# Patient Record
Sex: Male | Born: 1935 | Race: White | Hispanic: No | Smoking: Former smoker
Health system: Southern US, Community
[De-identification: ages and names within clinical notes are randomized; demographics above are authoritative.]

## PROBLEM LIST (undated history)

## (undated) DIAGNOSIS — I214 Non-ST elevation (NSTEMI) myocardial infarction: Principal | ICD-10-CM

## (undated) DIAGNOSIS — N183 Chronic kidney disease, stage 3 unspecified: Secondary | ICD-10-CM

## (undated) DIAGNOSIS — I429 Cardiomyopathy, unspecified: Secondary | ICD-10-CM

## (undated) DIAGNOSIS — I2510 Atherosclerotic heart disease of native coronary artery without angina pectoris: Secondary | ICD-10-CM

## (undated) DIAGNOSIS — C61 Malignant neoplasm of prostate: Secondary | ICD-10-CM

## (undated) DIAGNOSIS — K219 Gastro-esophageal reflux disease without esophagitis: Secondary | ICD-10-CM

## (undated) HISTORY — DX: Cardiomyopathy, unspecified: I42.9

## (undated) HISTORY — DX: Malignant neoplasm of prostate: C61

## (undated) HISTORY — DX: Non-ST elevation (NSTEMI) myocardial infarction: I21.4

## (undated) HISTORY — DX: Chronic kidney disease, stage 3 unspecified: N18.30

## (undated) HISTORY — DX: Chronic kidney disease, stage 3 (moderate): N18.3

## (undated) HISTORY — DX: Atherosclerotic heart disease of native coronary artery without angina pectoris: I25.10

---

## 1968-12-26 HISTORY — PX: HERNIA REPAIR: SHX51

## 2011-05-22 DIAGNOSIS — Z23 Encounter for immunization: Secondary | ICD-10-CM | POA: Diagnosis not present

## 2011-07-21 DIAGNOSIS — H348392 Tributary (branch) retinal vein occlusion, unspecified eye, stable: Secondary | ICD-10-CM | POA: Diagnosis not present

## 2011-07-21 DIAGNOSIS — H35359 Cystoid macular degeneration, unspecified eye: Secondary | ICD-10-CM | POA: Diagnosis not present

## 2011-07-21 DIAGNOSIS — H2510 Age-related nuclear cataract, unspecified eye: Secondary | ICD-10-CM | POA: Diagnosis not present

## 2011-10-22 DIAGNOSIS — M810 Age-related osteoporosis without current pathological fracture: Secondary | ICD-10-CM | POA: Diagnosis not present

## 2011-10-22 DIAGNOSIS — M899 Disorder of bone, unspecified: Secondary | ICD-10-CM | POA: Diagnosis not present

## 2011-10-22 DIAGNOSIS — C61 Malignant neoplasm of prostate: Secondary | ICD-10-CM | POA: Diagnosis not present

## 2011-10-23 ENCOUNTER — Encounter: Payer: Medicare Other | Admitting: Internal Medicine

## 2011-10-23 DIAGNOSIS — M949 Disorder of cartilage, unspecified: Secondary | ICD-10-CM | POA: Diagnosis not present

## 2011-10-23 DIAGNOSIS — M899 Disorder of bone, unspecified: Secondary | ICD-10-CM | POA: Diagnosis not present

## 2011-10-23 DIAGNOSIS — C7952 Secondary malignant neoplasm of bone marrow: Secondary | ICD-10-CM | POA: Diagnosis not present

## 2011-10-23 DIAGNOSIS — C61 Malignant neoplasm of prostate: Secondary | ICD-10-CM | POA: Diagnosis not present

## 2011-10-23 DIAGNOSIS — C7951 Secondary malignant neoplasm of bone: Secondary | ICD-10-CM | POA: Diagnosis not present

## 2011-11-09 DIAGNOSIS — H2510 Age-related nuclear cataract, unspecified eye: Secondary | ICD-10-CM | POA: Diagnosis not present

## 2011-11-17 DIAGNOSIS — H2510 Age-related nuclear cataract, unspecified eye: Secondary | ICD-10-CM | POA: Diagnosis not present

## 2011-11-17 DIAGNOSIS — H35359 Cystoid macular degeneration, unspecified eye: Secondary | ICD-10-CM | POA: Diagnosis not present

## 2011-11-17 DIAGNOSIS — H348392 Tributary (branch) retinal vein occlusion, unspecified eye, stable: Secondary | ICD-10-CM | POA: Diagnosis not present

## 2011-11-17 DIAGNOSIS — H35049 Retinal micro-aneurysms, unspecified, unspecified eye: Secondary | ICD-10-CM | POA: Diagnosis not present

## 2011-11-26 DIAGNOSIS — I429 Cardiomyopathy, unspecified: Secondary | ICD-10-CM

## 2011-11-26 DIAGNOSIS — I214 Non-ST elevation (NSTEMI) myocardial infarction: Secondary | ICD-10-CM

## 2011-11-26 HISTORY — DX: Non-ST elevation (NSTEMI) myocardial infarction: I21.4

## 2011-11-26 HISTORY — DX: Cardiomyopathy, unspecified: I42.9

## 2011-11-27 ENCOUNTER — Inpatient Hospital Stay (HOSPITAL_COMMUNITY)
Admission: RE | Admit: 2011-11-27 | Payer: Self-pay | Source: Other Acute Inpatient Hospital | Admitting: Cardiovascular Disease

## 2011-11-27 ENCOUNTER — Inpatient Hospital Stay (HOSPITAL_COMMUNITY)
Admission: AD | Admit: 2011-11-27 | Discharge: 2011-12-04 | DRG: 234 | Disposition: A | Discharge status: Home Health | Payer: Medicare Other | Source: Other Acute Inpatient Hospital | Attending: Cardiothoracic Surgery | Admitting: Cardiothoracic Surgery

## 2011-11-27 ENCOUNTER — Inpatient Hospital Stay (HOSPITAL_COMMUNITY): Payer: Medicare Other

## 2011-11-27 ENCOUNTER — Encounter (HOSPITAL_COMMUNITY)
Admission: AD | Disposition: A | Discharge status: Home / Self Care | Payer: Self-pay | Source: Other Acute Inpatient Hospital | Attending: Cardiothoracic Surgery

## 2011-11-27 ENCOUNTER — Other Ambulatory Visit: Payer: Self-pay | Admitting: Physician Assistant

## 2011-11-27 ENCOUNTER — Encounter (HOSPITAL_COMMUNITY): Payer: Self-pay | Admitting: *Deleted

## 2011-11-27 DIAGNOSIS — J984 Other disorders of lung: Secondary | ICD-10-CM | POA: Diagnosis not present

## 2011-11-27 DIAGNOSIS — Y832 Surgical operation with anastomosis, bypass or graft as the cause of abnormal reaction of the patient, or of later complication, without mention of misadventure at the time of the procedure: Secondary | ICD-10-CM | POA: Diagnosis not present

## 2011-11-27 DIAGNOSIS — Z880 Allergy status to penicillin: Secondary | ICD-10-CM | POA: Diagnosis not present

## 2011-11-27 DIAGNOSIS — M109 Gout, unspecified: Secondary | ICD-10-CM | POA: Diagnosis not present

## 2011-11-27 DIAGNOSIS — I219 Acute myocardial infarction, unspecified: Secondary | ICD-10-CM | POA: Diagnosis not present

## 2011-11-27 DIAGNOSIS — Z888 Allergy status to other drugs, medicaments and biological substances status: Secondary | ICD-10-CM

## 2011-11-27 DIAGNOSIS — N182 Chronic kidney disease, stage 2 (mild): Secondary | ICD-10-CM | POA: Diagnosis present

## 2011-11-27 DIAGNOSIS — I214 Non-ST elevation (NSTEMI) myocardial infarction: Secondary | ICD-10-CM | POA: Diagnosis not present

## 2011-11-27 DIAGNOSIS — I2510 Atherosclerotic heart disease of native coronary artery without angina pectoris: Secondary | ICD-10-CM | POA: Diagnosis present

## 2011-11-27 DIAGNOSIS — I517 Cardiomegaly: Secondary | ICD-10-CM | POA: Diagnosis not present

## 2011-11-27 DIAGNOSIS — I519 Heart disease, unspecified: Secondary | ICD-10-CM | POA: Diagnosis not present

## 2011-11-27 DIAGNOSIS — J811 Chronic pulmonary edema: Secondary | ICD-10-CM | POA: Diagnosis not present

## 2011-11-27 DIAGNOSIS — D62 Acute posthemorrhagic anemia: Secondary | ICD-10-CM | POA: Diagnosis not present

## 2011-11-27 DIAGNOSIS — Z885 Allergy status to narcotic agent status: Secondary | ICD-10-CM | POA: Diagnosis not present

## 2011-11-27 DIAGNOSIS — Z8546 Personal history of malignant neoplasm of prostate: Secondary | ICD-10-CM

## 2011-11-27 DIAGNOSIS — N189 Chronic kidney disease, unspecified: Secondary | ICD-10-CM | POA: Diagnosis not present

## 2011-11-27 DIAGNOSIS — Z01810 Encounter for preprocedural cardiovascular examination: Secondary | ICD-10-CM | POA: Diagnosis not present

## 2011-11-27 DIAGNOSIS — I4891 Unspecified atrial fibrillation: Secondary | ICD-10-CM | POA: Diagnosis not present

## 2011-11-27 DIAGNOSIS — J9819 Other pulmonary collapse: Secondary | ICD-10-CM | POA: Diagnosis not present

## 2011-11-27 DIAGNOSIS — Y9210 Unspecified residential institution as the place of occurrence of the external cause: Secondary | ICD-10-CM | POA: Diagnosis not present

## 2011-11-27 DIAGNOSIS — E7800 Pure hypercholesterolemia, unspecified: Secondary | ICD-10-CM | POA: Diagnosis not present

## 2011-11-27 DIAGNOSIS — M899 Disorder of bone, unspecified: Secondary | ICD-10-CM | POA: Diagnosis not present

## 2011-11-27 DIAGNOSIS — K219 Gastro-esophageal reflux disease without esophagitis: Secondary | ICD-10-CM | POA: Diagnosis present

## 2011-11-27 DIAGNOSIS — Z87891 Personal history of nicotine dependence: Secondary | ICD-10-CM

## 2011-11-27 DIAGNOSIS — E8779 Other fluid overload: Secondary | ICD-10-CM | POA: Diagnosis not present

## 2011-11-27 DIAGNOSIS — Z23 Encounter for immunization: Secondary | ICD-10-CM

## 2011-11-27 DIAGNOSIS — Z951 Presence of aortocoronary bypass graft: Secondary | ICD-10-CM

## 2011-11-27 DIAGNOSIS — D649 Anemia, unspecified: Secondary | ICD-10-CM | POA: Diagnosis present

## 2011-11-27 DIAGNOSIS — Z79899 Other long term (current) drug therapy: Secondary | ICD-10-CM

## 2011-11-27 DIAGNOSIS — R079 Chest pain, unspecified: Secondary | ICD-10-CM | POA: Diagnosis not present

## 2011-11-27 DIAGNOSIS — R918 Other nonspecific abnormal finding of lung field: Secondary | ICD-10-CM | POA: Diagnosis not present

## 2011-11-27 DIAGNOSIS — Z7982 Long term (current) use of aspirin: Secondary | ICD-10-CM | POA: Diagnosis not present

## 2011-11-27 DIAGNOSIS — J90 Pleural effusion, not elsewhere classified: Secondary | ICD-10-CM | POA: Diagnosis not present

## 2011-11-27 DIAGNOSIS — Z09 Encounter for follow-up examination after completed treatment for conditions other than malignant neoplasm: Secondary | ICD-10-CM | POA: Diagnosis not present

## 2011-11-27 HISTORY — PX: LEFT HEART CATHETERIZATION WITH CORONARY ANGIOGRAM: SHX5451

## 2011-11-27 HISTORY — DX: Gastro-esophageal reflux disease without esophagitis: K21.9

## 2011-11-27 LAB — URINALYSIS, ROUTINE W REFLEX MICROSCOPIC
Bilirubin Urine: NEGATIVE
Glucose, UA: NEGATIVE mg/dL
Hgb urine dipstick: NEGATIVE
Ketones, ur: NEGATIVE mg/dL
Nitrite: NEGATIVE
pH: 5.5 (ref 5.0–8.0)

## 2011-11-27 LAB — CBC
MCH: 30.5 pg (ref 26.0–34.0)
MCV: 90.8 fL (ref 78.0–100.0)
Platelets: 242 10*3/uL (ref 150–400)
RBC: 3.71 MIL/uL — ABNORMAL LOW (ref 4.22–5.81)

## 2011-11-27 LAB — CARDIAC PANEL(CRET KIN+CKTOT+MB+TROPI): Relative Index: 5.5 — ABNORMAL HIGH (ref 0.0–2.5)

## 2011-11-27 LAB — PROTIME-INR: Prothrombin Time: 14.3 seconds (ref 11.6–15.2)

## 2011-11-27 SURGERY — LEFT HEART CATHETERIZATION WITH CORONARY ANGIOGRAM
Anesthesia: LOCAL

## 2011-11-27 MED ORDER — SODIUM CHLORIDE 0.9 % IJ SOLN: 3.0000 mL | Freq: Two times a day (BID) | INTRAMUSCULAR | Status: DC

## 2011-11-27 MED ORDER — ALPRAZOLAM 0.25 MG PO TABS: 0.2500 mg | ORAL_TABLET | Freq: Two times a day (BID) | ORAL | Status: DC | PRN

## 2011-11-27 MED ORDER — NITROGLYCERIN 0.3 MG SL SUBL: 0.3000 mg | SUBLINGUAL_TABLET | SUBLINGUAL | Status: DC | PRN

## 2011-11-27 MED ORDER — SODIUM CHLORIDE 0.9 % IJ SOLN: 3.0000 mL | INTRAMUSCULAR | Status: DC | PRN

## 2011-11-27 MED ORDER — SODIUM CHLORIDE 0.9 % IV SOLN: 250.0000 mL | INTRAVENOUS | Status: DC | PRN

## 2011-11-27 MED ORDER — NITROGLYCERIN 0.4 MG SL SUBL: 0.4000 mg | SUBLINGUAL_TABLET | SUBLINGUAL | Status: DC | PRN

## 2011-11-27 MED ORDER — VERAPAMIL HCL 2.5 MG/ML IV SOLN
INTRAVENOUS | Status: AC
  Filled 2011-11-27: qty 2

## 2011-11-27 MED ORDER — SODIUM CHLORIDE 0.9 % IV SOLN: INTRAVENOUS | Status: DC

## 2011-11-27 MED ORDER — ATORVASTATIN CALCIUM 80 MG PO TABS: 80.0000 mg | ORAL_TABLET | Freq: Every day | ORAL | Status: DC

## 2011-11-27 MED ORDER — ONDANSETRON HCL 4 MG/2ML IJ SOLN: 4.0000 mg | Freq: Four times a day (QID) | INTRAMUSCULAR | Status: DC | PRN

## 2011-11-27 MED ORDER — DIAZEPAM 5 MG PO TABS: 5.0000 mg | ORAL_TABLET | ORAL | Status: AC

## 2011-11-27 MED ORDER — LIDOCAINE HCL (PF) 1 % IJ SOLN
INTRAMUSCULAR | Status: AC
  Filled 2011-11-27: qty 30

## 2011-11-27 MED ORDER — NITROGLYCERIN 0.2 MG/ML ON CALL CATH LAB
INTRAVENOUS | Status: AC
  Filled 2011-11-27: qty 1

## 2011-11-27 MED ORDER — MIDAZOLAM HCL 2 MG/2ML IJ SOLN
INTRAMUSCULAR | Status: AC
  Filled 2011-11-27: qty 2

## 2011-11-27 MED ORDER — ASPIRIN 81 MG PO CHEW
81.0000 mg | CHEWABLE_TABLET | Freq: Every day | ORAL | Status: DC
  Administered 2011-11-28: 81 mg via ORAL
  Filled 2011-11-27: qty 1

## 2011-11-27 MED ORDER — ATORVASTATIN CALCIUM 80 MG PO TABS
80.0000 mg | ORAL_TABLET | Freq: Every day | ORAL | Status: DC
  Administered 2011-11-28 – 2011-12-01 (×3): 80 mg via ORAL
  Filled 2011-11-27 (×5): qty 1

## 2011-11-27 MED ORDER — HEPARIN (PORCINE) IN NACL 2-0.9 UNIT/ML-% IJ SOLN
INTRAMUSCULAR | Status: AC
  Filled 2011-11-27: qty 2000

## 2011-11-27 MED ORDER — ASPIRIN 81 MG PO CHEW: 324.0000 mg | CHEWABLE_TABLET | ORAL | Status: DC

## 2011-11-27 MED ORDER — FENTANYL CITRATE 0.05 MG/ML IJ SOLN
INTRAMUSCULAR | Status: AC
  Filled 2011-11-27: qty 2

## 2011-11-27 MED ORDER — ACETAMINOPHEN 325 MG PO TABS
650.0000 mg | ORAL_TABLET | ORAL | Status: DC | PRN
  Administered 2011-11-28: 325 mg via ORAL
  Filled 2011-11-27: qty 2

## 2011-11-27 MED ORDER — HEPARIN (PORCINE) IN NACL 100-0.45 UNIT/ML-% IJ SOLN
950.0000 [IU]/h | INTRAMUSCULAR | Status: DC
  Administered 2011-11-28: 950 [IU]/h via INTRAVENOUS
  Filled 2011-11-27 (×2): qty 250

## 2011-11-27 MED ORDER — PANTOPRAZOLE SODIUM 40 MG PO TBEC
40.0000 mg | DELAYED_RELEASE_TABLET | Freq: Every day | ORAL | Status: DC
  Administered 2011-11-28: 40 mg via ORAL
  Filled 2011-11-27: qty 1

## 2011-11-27 MED ORDER — SODIUM CHLORIDE 0.9 % IV SOLN
INTRAVENOUS | Status: AC
  Administered 2011-11-27: 23:00:00 via INTRAVENOUS

## 2011-11-27 MED ORDER — METOPROLOL TARTRATE 12.5 MG HALF TABLET
12.5000 mg | ORAL_TABLET | Freq: Two times a day (BID) | ORAL | Status: DC
  Administered 2011-11-28 (×2): 12.5 mg via ORAL
  Filled 2011-11-27 (×4): qty 1

## 2011-11-27 MED ORDER — ASPIRIN EC 81 MG PO TBEC: 81.0000 mg | DELAYED_RELEASE_TABLET | Freq: Every day | ORAL | Status: DC

## 2011-11-27 MED ORDER — METOPROLOL TARTRATE 12.5 MG HALF TABLET
12.5000 mg | ORAL_TABLET | Freq: Four times a day (QID) | ORAL | Status: DC
  Administered 2011-11-27: 12.5 mg via ORAL
  Filled 2011-11-27: qty 1

## 2011-11-27 MED ORDER — ACETAMINOPHEN 325 MG PO TABS: 650.0000 mg | ORAL_TABLET | ORAL | Status: DC | PRN

## 2011-11-27 MED ORDER — NITROGLYCERIN 2 % TD OINT
0.5000 [in_us] | TOPICAL_OINTMENT | Freq: Four times a day (QID) | TRANSDERMAL | Status: DC
  Filled 2011-11-27: qty 30

## 2011-11-27 NOTE — Consult Note (Signed)
[address]            [place] [address]          [phone]       [name] Medical Record #[id number] Date of Birth: 1927-03-11  No ref. provider found No primary provider on file.  Chief Complaint:   No chief complaint on file.  chest pain  History of Present Illness:      The patient is an 78 year old gentleman admitted in transfer from [facility] hospital with unstable angina and positive cardiac enzymes with a troponin of 8.1. He has had nocturnal angina and exertional chest pain of increasing severity over the past 2 weeks. He was transferred to  for urgent cardiac catheterization today. Dr. Tedra Senegal performed coronary care grams without ventriculogram the to elevated BUN-creatinine. Patient has a high-grade 95% stenosis proximal LAD just past the left main bifurcation. There is moderate ostial circumflex disease and ostial RCA stenosis. He is graftable coronary vessels and LV function will be evaluated by a pending transthoracic echocardiogram. The patient is currently stable on heparin therapy in a sinus rhythm with stable blood pressure. Chest x-ray shows some interstitial edema.  Current Activity/ Functional Status: Patient highly functional still works part-time is in a Health and safety inspector in [place]   Past Medical History  Diagnosis Date  . Cancer     prostate  . GERD (gastroesophageal reflux disease)     Past Surgical History  Procedure Date  . Hernia repair 1970's     history of laparotomy for intestinal torsion-volvulus  History  Smoking status  . Former Smoker -- 2.0 packs/day for 40 years  . Types: Cigars, Cigarettes  Smokeless tobacco  . Not on file    History  Alcohol Use  . 0.6 oz/week  . 1 Shots of liquor per week    History   Social History  . Marital Status: Divorced    Spouse Name: N/A    Number of Children: N/A  . Years of Education: N/A   Occupational History  . Not on file.    Social History Main Topics  . Smoking status: Former Smoker -- 2.0 packs/day for 40 years    Types: Cigars, Cigarettes  . Smokeless tobacco: Not on file  . Alcohol Use: 0.6 oz/week    1 Shots of liquor per week  . Drug Use: No  . Sexually Active: No   Other Topics Concern  . Not on file   Social History Narrative  . No narrative on file    Allergies  Allergen Reactions  . Morphine And Related Shortness Of Breath  . Penicillins Shortness Of Breath    Current Facility-Administered Medications  Medication Dose Route Frequency Provider Last Rate Last Dose  . 0.9 %  sodium chloride infusion   Intravenous Continuous Herby Abraham, MD      . 0.9 %  sodium chloride infusion  250 mL Intravenous PRN Prescott Parma, PA      . 0.9 %  sodium chloride infusion   Intravenous Continuous Prescott Parma, PA      . acetaminophen (TYLENOL) tablet 650 mg  650 mg Oral Q4H PRN Herby Abraham, MD      . ALPRAZolam Prudy Feeler) tablet 0.25 mg  0.25 mg Oral BID PRN Herby Abraham, MD      . aspirin chewable tablet 81 mg  81 mg Oral Daily Herby Abraham, MD      . atorvastatin (LIPITOR) tablet 80 mg  80 mg Oral q1800 Herby Abraham, MD      . diazepam (VALIUM) tablet 5 mg  5 mg Oral On Call Prescott Parma, PA      . fentaNYL (SUBLIMAZE) 0.05 MG/ML injection           . heparin 2-0.9 UNIT/ML-% infusion           . heparin ADULT infusion 100 units/mL (25000 units/250 mL)  950 Units/hr Intravenous Continuous Herby Abraham, MD      . lidocaine (XYLOCAINE) 1 % injection           . metoprolol tartrate (LOPRESSOR) tablet 12.5 mg  12.5 mg Oral QID Prescott Parma, PA      . midazolam (VERSED) 2 MG/2ML injection           . nitroGLYCERIN (NITROGLYN) 2 % ointment 0.5 inch  0.5 inch Topical Q6H Prescott Parma, PA      . nitroGLYCERIN (NITROSTAT) SL tablet 0.4 mg  0.4 mg Sublingual Q5 Min x 3 PRN Prescott Parma, PA      . nitroGLYCERIN (NTG ON-CALL) 0.2 mg/mL injection           . ondansetron (ZOFRAN) injection 4  mg  4 mg Intravenous Q6H PRN Herby Abraham, MD      . pantoprazole (PROTONIX) EC tablet 40 mg  40 mg Oral Q1200 Prescott Parma, PA      . verapamil (ISOPTIN) 2.5 MG/ML injection           . DISCONTD: acetaminophen (TYLENOL) tablet 650 mg  650 mg Oral Q4H PRN Prescott Parma, PA      . DISCONTD: aspirin chewable tablet 324 mg  324 mg Oral Pre-Cath Prescott Parma, PA      . DISCONTD: aspirin EC tablet 81 mg  81 mg Oral Daily Prescott Parma, [place]      . DISCONTD: atorvastatin (LIPITOR) tablet 80 mg  80 mg Oral q1800 Prescott Parma, PA      . DISCONTD: nitroGLYCERIN (NITROSTAT) SL tablet 0.3 mg  0.3 mg Sublingual Q5 min PRN Herby Abraham, MD      . DISCONTD: ondansetron [facility]) injection 4 mg  4 mg Intravenous Q6H PRN Prescott Parma, PA      . DISCONTD: sodium chloride 0.9 % injection 3 mL  3 mL Intravenous Q12H Prescott Parma, PA      . DISCONTD: sodium chloride 0.9 % injection 3 mL  3 mL Intravenous PRN Prescott Parma, PA         History reviewed. No pertinent family history.   Review of Systems:     Cardiac Review of Systems: Y or N  Chest Pain y[    ]  Resting SOB [n   ] Exertional SOB  Cove.Etienne  ]  [place].Mounts [  ]   Pedal Edema [  n ]    Palpitations [  ] Syncope  [n  ]   Presyncope [   ]  General Review of Systems: [Y] = yes [  ]=no Constitional: recent weight change [  ]; anorexia [  ]; fatigue [  ]; nausea [  ]; night sweats [  ]; fever [n  ]; or chills [ n ];  Eye : blurred vision [  ]; diplopia [   ]; vision changes [  ];  Amaurosis fugax[  ]; Resp: cough [  ];  wheezing[  ];  hemoptysis[n  ]; shortness of breath[  y]; paroxysmal nocturnal dyspnea[y  ]; dyspnea on exertion[  y]; or orthopnea[  ];  GI:  gallstones[  ], vomiting[  ];  dysphagia[  ]; melena[  ];  hematochezia [  ]; heartburn[  ];   Hx of  Colonoscopy[  ]; GU: kidney stones [  ]; hematuria[  ];   dysuria [  ];   nocturia[  ];  history of     obstruction [  ];                 Skin: rash, swelling[  ];, hair loss[  ];  peripheral edema[  ];  or itching[  ]; Musculosketetal: myalgias[  ];  joint swelling[  ];  joint erythema[  ];  joint pain[  ];  back pain[  ];  Heme/Lymph: bruising[  ];  bleeding[  ];  anemia[  ];  Neuro: TIA[  ];  headaches[  ];  stroke[  ];  vertigo[  ];  seizures[  ];   paresthesias[  ];  difficulty walking[n  ];  Psych:depression[  ]; anxiety[  ];  Endocrine: diabetes[  ];  thyroid dysfunction[  ];  Immunizations: Flu [  ]; Pneumococcal[  ];  Other:  Physical Exam: BP 106/66  Pulse 63  Temp 99 F (37.2 C) (Oral)  Resp 16  Ht 5\' 9"  (1.753 m)  Wt 173 lb 15.1 oz (78.9 kg)  BMI 25.69 kg/m2  Exam General alert and oriented in the CCU in no acute distress compression dressing on right wrist at radial artery cath site  HEENT normocephalic pupils equal dentition good  Neck without JVD mass or carotid bruit  Chest no deformity or tenderness breath sounds clear bilaterally Cardiac regular rhythm without murmur or gallop Abdomen well-healed mid line surgical scar abdomen without pulsatile mass or tenderness Extremities no clubbing cyanosis edema Vascular positive palpable pulses in the extremities no venous insufficiency noted neurologic alert and oriented no focal motor deficit  Diagnostic Studies & Laboratory data:  Coronary care and is reviewed with Dr. Tedra Senegal 2-D echo pending   Recent Radiology Findings:   Dg Chest Port 1 View  11/27/2011  *RADIOLOGY REPORT*  Clinical Data: Chest pain.  PORTABLE CHEST - 1 VIEW  Comparison: Chest x-ray 82,013.  Findings: Lung volumes are low.  Diffuse prominence of the interstitial markings, most pronounced in the lung bases and periphery of the lungs, similar to priors, compatible with underlying interstitial lung disease.  No definite acute consolidative airspace disease.  No pleural effusions.  Pulmonary vasculature and the  cardiomediastinal silhouette are within normal limits.  Atherosclerosis in the thoracic aorta.  IMPRESSION: 1.  Appearance of the lungs is again compatible with an underlying interstitial lung disease.  This could be better characterized with high resolution CT of the thorax if clinically indicated. 2.  Atherosclerosis.  Original Report Authenticated By: Florencia Reasons, M.D.      Recent Lab Findings: No results found for this basename: WBC, HGB, HCT, PLT, GLUCOSE, CHOL, TRIG, HDL, LDLDIRECT, LDLCALC, ALT, AST, NA, K, CL, CREATININE, BUN, CO2, TSH, INR, GLUF, HGBA1C      Assessment / Plan:   Stabilization on IV heparin beta blockers and nitroglycerin Preoperative evaluation of a 2-D echo carotid Dopplers Surgical coronary graduation and next  36-48 hours.   Plan discussed with patient and he understands and agrees to proceed.

## 2011-11-27 NOTE — Progress Notes (Signed)
Patient stable.  No chest pain.  His only pain was at 2 am for an hour, and he has had none since. Echo study reviewed.  Official report pending.  Anteroseptal area is severely hypokinetic, but there is some myocardial thickening.   Hopefully it will recover.   Surgery is planned for early in week with grafts to LAD, circ, and RCA.   Patient is agreeable.

## 2011-11-27 NOTE — Progress Notes (Signed)
Day of Surgery Procedure(s) (LRB): LEFT HEART CATHETERIZATION WITH CORONARY ANGIOGRAM (N/A) Subjective:unstable angina with SEMI Now pain free Objective: Vital signs in last 24 hours: Temp:  [99 F (37.2 C)] 99 F (37.2 C) (08/02 2104) Pulse Rate:  [63-120] 63  (08/02 2104) Cardiac Rhythm:  [-]  Resp:  [16] 16  (08/02 2104) BP: (106)/(66) 106/66 mmHg (08/02 2104) Weight:  [173 lb 15.1 oz (78.9 kg)] 173 lb 15.1 oz (78.9 kg) (08/02 2104)  Hemodynamic parameters for last 24 hours:  NSR  Intake/Output from previous day:   Intake/Output this shift:    Lungs clear extrem warm R wrist compression device  Lab Results: No results found for this basename: WBC:2,HGB:2,HCT:2,PLT:2 in the last 72 hours BMET: No results found for this basename: NA:2,K:2,CL:2,CO2:2,GLUCOSE:2,BUN:2,CREATININE:2,CALCIUM:2 in the last 72 hours  PT/INR: No results found for this basename: LABPROT,INR in the last 72 hours ABG No results found for this basename: phart, pco2, po2, hco3, tco2, acidbasedef, o2sat   CBG (last 3)  No results found for this basename: GLUCAP:3 in the last 72 hours  Assessment/Plan: S/P Procedure(s) (LRB): LEFT HEART CATHETERIZATION WITH CORONARY ANGIOGRAM (N/A) CABG in 48 hrs if creat remains stable   LOS: 0 days    VAN TRIGT III,PETER 11/27/2011

## 2011-11-27 NOTE — Progress Notes (Signed)
  Echocardiogram 2D Echocardiogram has been performed.  [name] 11/27/2011, 9:59 PM

## 2011-11-27 NOTE — H&P (Signed)
NAME:  GHAZI, RUMPF ROOM: 209  UNIT NUMBER:  [id number] LOCATION: 76F 209 01 ADM/VISIT DATE:  11/27/2011   ADM Vaughan BrownerKathaleen Grinder:  [phone] DOB: 12-11-1926   PRIMARY CARDIOLOGIST:  Marca Ancona, M.D. (new).  REFERRING PHYSICIAN:  Doreen Beam, M.D.  REASON FOR CONSULTATION:  Chest pain.  HISTORY OF PRESENT ILLNESS:  Mr. Willden is a very pleasant 78 year old male, with no known history of heart disease, directly admitted from Dr. Sherril Croon' office a short while ago for a evaluation of chest pain.   A 12-lead EKG in the office was abnormal, with symmetric T-wave inversion in the anteroseptal leads as well as in lead AVL.  Initial cardiac markers were also abnormal, with CPK 500/4.6 (9.3%), and a troponin I of 8.6.  Patient presents with no known history of hypertension, diabetes mellitus, dyslipidemia, or family history of premature coronary artery disease.  He quit smoking tobacco in 1990.  Patient also presents with no prior formal cardiology evaluation or diagnostic testing.  Patient had new onset nocturnal chest discomfort, approximately 2 weeks ago, which awoke him from sleep during early morning hours.  Episode lasted approximately 20 minutes and was moderately intense (4/10), with some associated diaphoresis, but no radiation to the jaw or upper extremities.  He referred to it as a "pressure sensation", which he attributed to his occasional reflux symptoms.  At approximately 3 a.m. this morning, however, he had a more severe episode (7/10), again associated with diaphoresis, and this time with some mild nausea but no vomiting.  Again there was no radiation to the jaw or upper extremities.  This episode lasted approximately 1 hour in duration.  Patient denies any antecedent history of exertional angina.  Over the last month or so, however, he has become more fatigued and "tired" while doing his usual yard work.  He denies any symptoms suggestive of congestive heart failure.  ALLERGIES:  MORPHINE,  PENICILLIN (ANAPHYLAXIS).  HOME MEDICATIONS: 1. Ibuprofen 200 mg p.r.n. 2. Stool softener 1 tablet daily. 3. Calcium 500 mg daily. 4. Multivitamin 1 tablet daily.  PAST MEDICAL HISTORY: 1. Adenocarcinoma of the prostate. a. Bilateral orchiectomy, 2008. 2. Osteopenia. 3. Chronic kidney disease. 4. GERD. 5. Tobacco, remote.  SURGICAL HISTORY:  Abdominal surgery, inguinal hernia repair (2).  SOCIAL HISTORY:  Married, semiretired Scientist, clinical (histocompatibility and immunogenetics).  Quit smoking in 1990, and started at the age of 61.  Has 5 children.  Drinks several ounces of alcohol per week.  FAMILY HISTORY:  Negative for coronary artery disease.  REVIEW OF SYSTEMS:  Denies history of hypertension, diabetes mellitus, dyslipidemia, TIA, stroke, MI, or CHF.  Denies any evidence of overt bleeding.  The remaining systems reviewed and are negative.  PHYSICAL EXAMINATION:  Vital signs:  Blood pressure currently 118/78 , pulse 72 , regular, respirations 20 , temperature 97.5, sats 96% on room air, weight 170 pounds.  General:  An 78 year old male, well nourished, well developed, sitting upright in no distress.  HEENT:  Normocephalic, atraumatic.  PERRLA, EOMI.  Neck:  Palpable bilateral carotid pulses without bruits; no JVD at 90 degrees.  Lungs:  Mild right base crackles, no wheezes.  Heart:  Regular rate and rhythm.  No significant murmurs.  No rubs or gallops.  Abdomen:  Soft, intact bowel sounds.  Extremities:  Palpable bilateral femoral pulses without bruits; palpable bilateral dorsalis pedis pulses, with no significant peripheral edema.  Skin:  Warm and dry.  Musculoskeletal:  No obvious deformity.  Neurologic:  Alert and oriented.  RADIOLOGIC STUDIES:  Admission chest x-ray:  Nonspecific reticular interstitial marking; atelectasis/infiltrative density in right base and posterior base; osteopenia with degenerative spondylosis; new lower thoracic vertebral body compression fracture, since prior study of 2008.  Admission EKG:   Normal sinus rhythm at 74 bpm; LAD; symmetric T-wave inversion in leads V1, V2, AVL, and slight T-wave inversion in lead 1.  LABORATORY DATA:  CPK 500/46 (9.3%).  Troponin I 8.6.  INR 1.0.  D-dimer 0.35.  BNP 440.  Sodium 139, potassium 4.4, BUN 37, creatinine 1.4 (GFR 49), glucose 113.  CBC:  WBC 9900, hemoglobin 13, hematocrit 40, and platelets 311,000.  IMPRESSION: 1. Acute coronary syndrome. a. Anterolateral electrocardiogram changes. b. Troponin I 8.6. 2. Remote tobacco. 3. Gastroesophageal reflux disease. 4. Renal insufficiency.  PLAN:  Recommendation is to transfer patient directly to [facility], so as to proceed with diagnostic coronary angiography and probable percutaneous coronary intervention.  The patient is in agreement with this recommendation, and the risks/benefits of the procedure were discussed.  Regarding medications, the patient has received full-dose coated aspirin and 1 dose of Lovenox 77 mg.  We will initiate beta blocker therapy with 5 mg IV Lopressor now, Nitrol paste 1/2 inch, and high-dose statin.  Lipid status will be assessed with FLP in a.m.  Cardiac markers will be cycled.  Followup EKG in a.m.  Close monitoring of renal function, with followup metabolic profile in a.m., as well.  This plan was discussed with, and approved by, Dr. Marca Ancona.  Dictated by Rozell Searing, PA for Marca Ancona, M.D.   __________________________    Rozell Searing, P.A. Alinda Money D: 11/27/2011 1507 T: 11/27/2011 1526 P: Jefm Petty

## 2011-11-27 NOTE — Progress Notes (Addendum)
ANTICOAGULATION CONSULT NOTE - Initial Consult  Pharmacy Consult for Heparin Indication: ACS/STEMI  Allergies  Allergen Reactions  . Morphine And Related Shortness Of Breath  . Penicillins Shortness Of Breath    Patient Measurements: Height: 5\' 9"  (175.3 cm) Weight: 173 lb 15.1 oz (78.9 kg) IBW/kg (Calculated) : 70.7  Heparin Dosing Weight: 78.9kg  Vital Signs: Temp: 99 F (37.2 C) (08/02 2104) Temp src: Oral (08/02 2104) BP: 106/66 mmHg (08/02 2104) Pulse Rate: 63  (08/02 2104)  Labs: No results found for this basename: HGB:2,HCT:3,PLT:3,APTT:3,LABPROT:3,INR:3,HEPARINUNFRC:3,CREATININE:3,CKTOTAL:3,CKMB:3,TROPONINI:3 in the last 72 hours  CrCl is unknown because no creatinine reading has been taken.   Medical History: Past Medical History  Diagnosis Date  . Cancer     prostate    Medications:  Scheduled:    . aspirin  81 mg Oral Daily  . atorvastatin  80 mg Oral q1800  . diazepam  5 mg Oral On Call  . fentaNYL      . heparin      . lidocaine      . metoprolol tartrate  12.5 mg Oral QID  . midazolam      . nitroGLYCERIN  0.5 inch Topical Q6H  . nitroGLYCERIN      . pantoprazole  40 mg Oral Q1200  . verapamil      . DISCONTD: aspirin  324 mg Oral Pre-Cath  . DISCONTD: aspirin EC  81 mg Oral Daily  . DISCONTD: atorvastatin  80 mg Oral q1800  . DISCONTD: sodium chloride  3 mL Intravenous Q12H    Assessment: 78 yr old male to start heparin 8 hours after sheath removal for ACS/STEMI.  Sheath removal was at 2020 tonight.  Goal of Therapy:  Heparin level goal 0.3-0.5 Monitor platelets by anticoagulation protocol: Yes   Plan:  Start Heparin at 950 units/hr at 0430 on 11/28/11. Check heparin level 8 hours after drip start. Daily heparin level/cbc.   Wendie Simmer, PharmD, BCPS Clinical Pharmacist  Pager: [phone]

## 2011-11-27 NOTE — H&P (Signed)
  The patient was seen by Dr. Shirlee Latch and I have also interviewed the patient.  Note is made of allergies to penicillin and morphine.  He began with chest pain at 300 am and then saw his primary MD.  He has some T wave inversion anteriorly and markedly positive enzymes.  His BUN and CR are mildly elevated, but we have been hydrating him in the holding area.  I have reviewed the risks and benefits of cardiac cath with the patient.  He received enox at about 2:30 pm today.  He has had ASA..    We will proceed with diagnostic cath.  Depending on findings, we will make plans regarding definitive treatment, but we will need to limit contrast.

## 2011-11-27 NOTE — CV Procedure (Signed)
   Cardiac Catheterization Procedure Note  Name: [name] [name] MRN: [id number] DOB: 13-Mar-1927  Procedure: Placement of catheters without left heart cath, Selective Coronary Angiography,   Indication: 78 year old male with recent onset of chest pain, positive enzymes, and anterior T changes.  He is stable with CKD, stage 2.  Risks reviewed and patient agreeable.     Procedural Details: The left wrist was prepped, draped, and anesthetized with 1% lidocaine. Using the modified Seldinger technique, a 5 French sheath was introduced into the left radial artery. 3 mg of verapamil was administered through the sheath;  No heparin was given as the patient was given [place] Lovenox prior to arrival. Standard Judkins catheters were used for selective coronary angiography.  Access to the LCA was quite difficult, possibly due to some ostial disease. A 3.0 GC eventually was successfully navigated.  Catheter exchanges were performed over an exchange length guidewire. There were no immediate procedural complications. A TR band was used for radial hemostasis at the completion of the procedure.  The patient was transferred to the post catheterization recovery area for further monitoring.  Dr. Donata Clay came to the lab and we reviewed the films in detail.  Given the circumstances we agreed that CABG appeared to be the best option.    Procedural Findings: Hemodynamics: AO 98/70  (82) LV not done  Coronary angiography: Coronary dominance: right  Left main stem: ostial 50-60% narrowing without hemodynamic obstruction  Left anterior descending (LAD): Takes a sharp turn at the proximal takeoff followed by a 90% eccentric ruptured plaque.  There is 50% eccentric narrowing after the large diagonal.  The mid vessel has a calcified 80% lesion in the mid distal junction. There is diffuse luminal irregularities in the diagonal.    Left circumflex (LCx): provides a small, diffusely irregular OM1.  The AV circ just after the OM 1  has segmental 60% narrowing. The OM is large a graftable.   Right coronary artery (RCA): Large caliber vessel with ostial narrowing of 60-70% and calcified.  Just after this, there is a [place] RCA with mild diffuse luminal irregularities.  The PDA and PLA are relatively large vessels.    Left ventriculography: Not done due to renal insufficiency.  Final Conclusions:   1.  High grade proximal LAD disease with incidental LMCA, ostial RCA, and other findings as noted.  Recommendations:  1.  2D echo at bedside 2.  Resume heparin in 6 hours when enox has warn off.  3.  Keep in unit over weekend.   Shawnie Pons 11/27/2011, 8:42 PM

## 2011-11-28 ENCOUNTER — Inpatient Hospital Stay (HOSPITAL_COMMUNITY): Payer: Medicare Other

## 2011-11-28 DIAGNOSIS — I214 Non-ST elevation (NSTEMI) myocardial infarction: Secondary | ICD-10-CM | POA: Diagnosis present

## 2011-11-28 DIAGNOSIS — I2510 Atherosclerotic heart disease of native coronary artery without angina pectoris: Secondary | ICD-10-CM | POA: Diagnosis not present

## 2011-11-28 DIAGNOSIS — D649 Anemia, unspecified: Secondary | ICD-10-CM | POA: Diagnosis present

## 2011-11-28 LAB — CARDIAC PANEL(CRET KIN+CKTOT+MB+TROPI)
CK, MB: 14 ng/mL (ref 0.3–4.0)
Relative Index: 4.8 — ABNORMAL HIGH (ref 0.0–2.5)
Relative Index: 3.7 — ABNORMAL HIGH (ref 0.0–2.5)
Total CK: 294 U/L — ABNORMAL HIGH (ref 7–232)
Troponin I: 3.96 ng/mL (ref <0.30)
Troponin I: 4.22 ng/mL (ref <0.30)

## 2011-11-28 LAB — BLOOD GAS, ARTERIAL
Acid-Base Excess: 0.2 mmol/L (ref 0.0–2.0)
Bicarbonate: 24.1 mEq/L — ABNORMAL HIGH (ref 20.0–24.0)
Drawn by: 347621
FIO2: 0.28 %
O2 Saturation: 98.9 %
Patient temperature: 98.6
TCO2: 25.2 mmol/L (ref 0–100)
pCO2 arterial: 37.4 mmHg (ref 35.0–45.0)
pH, Arterial: 7.425 (ref 7.350–7.450)
pO2, Arterial: 114 mmHg — ABNORMAL HIGH (ref 80.0–100.0)

## 2011-11-28 LAB — CBC
Hemoglobin: 10.9 g/dL — ABNORMAL LOW (ref 13.0–17.0)
MCH: 31.1 pg (ref 26.0–34.0)
MCV: 91.7 fL (ref 78.0–100.0)
RBC: 3.51 MIL/uL — ABNORMAL LOW (ref 4.22–5.81)

## 2011-11-28 LAB — HEPARIN LEVEL (UNFRACTIONATED): Heparin Unfractionated: 0.45 IU/mL (ref 0.30–0.70)

## 2011-11-28 LAB — LIPID PANEL
Cholesterol: 162 mg/dL (ref 0–200)
HDL: 59 mg/dL (ref >39)
LDL Cholesterol: 80 mg/dL (ref 0–99)
LDL Cholesterol: 79 mg/dL (ref 0–99)
Total CHOL/HDL Ratio: 2.7 RATIO
Triglycerides: 120 mg/dL (ref <150)
VLDL: 24 mg/dL (ref 0–40)
VLDL: 24 mg/dL (ref 0–40)

## 2011-11-28 LAB — BASIC METABOLIC PANEL
CO2: 23 mEq/L (ref 19–32)
Glucose, Bld: 97 mg/dL (ref 70–99)
Potassium: 3.9 mEq/L (ref 3.5–5.1)
Sodium: 140 mEq/L (ref 135–145)

## 2011-11-28 LAB — ABO/RH: ABO/RH(D): AB POS

## 2011-11-28 LAB — TSH: TSH: 1.562 u[IU]/mL (ref 0.350–4.500)

## 2011-11-28 LAB — PREPARE RBC (CROSSMATCH)

## 2011-11-28 MED ORDER — NITROGLYCERIN IN D5W 200-5 MCG/ML-% IV SOLN
2.0000 ug/min | INTRAVENOUS | Status: AC
  Administered 2011-11-29: 10 ug/min via INTRAVENOUS
  Filled 2011-11-28: qty 250

## 2011-11-28 MED ORDER — BISACODYL 5 MG PO TBEC: 5.0000 mg | DELAYED_RELEASE_TABLET | Freq: Once | ORAL | Status: DC

## 2011-11-28 MED ORDER — MAGNESIUM SULFATE 50 % IJ SOLN
40.0000 meq | INTRAMUSCULAR | Status: DC
  Filled 2011-11-28: qty 10

## 2011-11-28 MED ORDER — POTASSIUM CHLORIDE 2 MEQ/ML IV SOLN
80.0000 meq | INTRAVENOUS | Status: DC
  Filled 2011-11-28: qty 40

## 2011-11-28 MED ORDER — MAGNESIUM HYDROXIDE 400 MG/5ML PO SUSP
30.0000 mL | Freq: Every day | ORAL | Status: DC | PRN
  Administered 2011-11-28: 30 mL via ORAL
  Filled 2011-11-28: qty 30

## 2011-11-28 MED ORDER — MOXIFLOXACIN HCL IN NACL 400 MG/250ML IV SOLN
400.0000 mg | INTRAVENOUS | Status: DC
  Filled 2011-11-28 (×2): qty 250

## 2011-11-28 MED ORDER — DIAZEPAM 2 MG PO TABS
2.0000 mg | ORAL_TABLET | Freq: Once | ORAL | Status: AC
  Administered 2011-11-29: 2 mg via ORAL
  Filled 2011-11-28: qty 1

## 2011-11-28 MED ORDER — TRANEXAMIC ACID 100 MG/ML IV SOLN
1.5000 mg/kg/h | INTRAVENOUS | Status: AC
  Administered 2011-11-29: 1.5 mg/kg/h via INTRAVENOUS
  Filled 2011-11-28: qty 25

## 2011-11-28 MED ORDER — SODIUM BICARBONATE 8.4 % IV SOLN
INTRAVENOUS | Status: AC
  Administered 2011-11-29: 10:00:00
  Filled 2011-11-28: qty 2.5

## 2011-11-28 MED ORDER — LORAZEPAM 0.5 MG PO TABS: 0.5000 mg | ORAL_TABLET | ORAL | Status: DC | PRN

## 2011-11-28 MED ORDER — DOPAMINE-DEXTROSE 3.2-5 MG/ML-% IV SOLN
2.0000 ug/kg/min | INTRAVENOUS | Status: AC
  Administered 2011-11-29: 3 ug/kg/min via INTRAVENOUS
  Filled 2011-11-28: qty 250

## 2011-11-28 MED ORDER — SODIUM CHLORIDE 0.9 % IV SOLN
INTRAVENOUS | Status: AC
  Administered 2011-11-29: 1 [IU]/h via INTRAVENOUS
  Filled 2011-11-28: qty 1

## 2011-11-28 MED ORDER — TEMAZEPAM 15 MG PO CAPS: 15.0000 mg | ORAL_CAPSULE | Freq: Once | ORAL | Status: AC | PRN

## 2011-11-28 MED ORDER — CHLORHEXIDINE GLUCONATE 4 % EX LIQD
60.0000 mL | Freq: Once | CUTANEOUS | Status: AC
  Administered 2011-11-29: 4 via TOPICAL
  Filled 2011-11-28: qty 60

## 2011-11-28 MED ORDER — TRANEXAMIC ACID (OHS) BOLUS VIA INFUSION
15.0000 mg/kg | INTRAVENOUS | Status: DC
  Filled 2011-11-28: qty 1184

## 2011-11-28 MED ORDER — PNEUMOCOCCAL VAC POLYVALENT 25 MCG/0.5ML IJ INJ
0.5000 mL | INJECTION | INTRAMUSCULAR | Status: DC
  Filled 2011-11-28: qty 0.5

## 2011-11-28 MED ORDER — TRANEXAMIC ACID (OHS) PUMP PRIME SOLUTION
2.0000 mg/kg | INTRAVENOUS | Status: DC
  Filled 2011-11-28: qty 1.58

## 2011-11-28 MED ORDER — EPINEPHRINE HCL 1 MG/ML IJ SOLN
0.5000 ug/min | INTRAVENOUS | Status: DC
  Filled 2011-11-28: qty 4

## 2011-11-28 MED ORDER — DEXMEDETOMIDINE HCL IN NACL 400 MCG/100ML IV SOLN
0.1000 ug/kg/h | INTRAVENOUS | Status: AC
  Administered 2011-11-29: .2 ug/kg/h via INTRAVENOUS
  Filled 2011-11-28: qty 100

## 2011-11-28 MED ORDER — METOPROLOL TARTRATE 12.5 MG HALF TABLET
12.5000 mg | ORAL_TABLET | Freq: Once | ORAL | Status: AC
  Administered 2011-11-29: 12.5 mg via ORAL
  Filled 2011-11-28: qty 1

## 2011-11-28 MED ORDER — PHENYLEPHRINE HCL 10 MG/ML IJ SOLN
30.0000 ug/min | INTRAMUSCULAR | Status: AC
  Administered 2011-11-29: 6.6 ug/min via INTRAVENOUS
  Filled 2011-11-28: qty 2

## 2011-11-28 MED ORDER — VANCOMYCIN HCL 1000 MG IV SOLR
1250.0000 mg | INTRAVENOUS | Status: AC
  Administered 2011-11-29: 1250 mg via INTRAVENOUS
  Filled 2011-11-28: qty 1250

## 2011-11-28 NOTE — Progress Notes (Addendum)
VASCULAR LAB PRELIMINARY  Pre-op Cardiac Surgery  Carotid Findings:  Bilaterally no significant ICA stenosis with antegrade vertebral flow.  Upper Extremity Right Left  Brachial Pressures 92 109  Radial Waveforms Not obtained due to IV bandage placement Bi  Ulnar Waveforms Bi Bi  Palmar Arch (Allen's Test) Could not obtain radial due to IV bandage placement. Waveform remains normal with ulnar compression. Normal with radial and ulnar compression.     Findings:  Bilateral palpable pedal pulses.   Farrel Demark, RDMS, RVT

## 2011-11-28 NOTE — Plan of Care (Signed)
Problem: Consults Goal: Cardiac Surgery Patient Education ( See Patient Education module for education specifics.)  Outcome: Completed/Met Date Met:  11/28/11 Given booklet,watched videos

## 2011-11-28 NOTE — Progress Notes (Signed)
HPI:  The patient is stable.  He denies any chest pain.  No shortness of breath.  Details of anatomy reviewed in detail with patient and daughter.  For CABG soon.   Carotids completed.  Current Facility-Administered Medications  Medication Dose Route Frequency Provider Last Rate Last Dose  . 0.9 %  sodium chloride infusion   Intravenous Continuous Herby Abraham, MD 125 mL/hr at 11/27/11 2247    . 0.9 %  sodium chloride infusion  250 mL Intravenous PRN Prescott Parma, PA      . acetaminophen (TYLENOL) tablet 650 mg  650 mg Oral Q4H PRN Herby Abraham, MD   325 mg at 11/28/11 5621  . ALPRAZolam Prudy Feeler) tablet 0.25 mg  0.25 mg Oral BID PRN Herby Abraham, MD      . aspirin chewable tablet 81 mg  81 mg Oral Daily Herby Abraham, MD   81 mg at 11/28/11 0947  . atorvastatin (LIPITOR) tablet 80 mg  80 mg Oral q1800 Herby Abraham, MD      . diazepam (VALIUM) tablet 5 mg  5 mg Oral On Call Prescott Parma, PA      . fentaNYL (SUBLIMAZE) 0.05 MG/ML injection           . heparin 2-0.9 UNIT/ML-% infusion           . heparin ADULT infusion 100 units/mL (25000 units/250 mL)  950 Units/hr Intravenous Continuous Herby Abraham, MD 9.5 mL/hr at 11/28/11 0418 950 Units/hr at 11/28/11 0418  . lidocaine (XYLOCAINE) 1 % injection           . LORazepam (ATIVAN) tablet 0.5-1 mg  0.5-1 mg Oral Q4H PRN Kerin Perna, MD      . metoprolol tartrate (LOPRESSOR) tablet 12.5 mg  12.5 mg Oral BID Herby Abraham, MD   12.5 mg at 11/28/11 0947  . midazolam (VERSED) 2 MG/2ML injection           . nitroGLYCERIN (NITROSTAT) SL tablet 0.4 mg  0.4 mg Sublingual Q5 Min x 3 PRN Prescott Parma, PA      . nitroGLYCERIN (NTG ON-CALL) 0.2 mg/mL injection           . ondansetron (ZOFRAN) injection 4 mg  4 mg Intravenous Q6H PRN Herby Abraham, MD      . pantoprazole (PROTONIX) EC tablet 40 mg  40 mg Oral Q1200 Prescott Parma, PA      . pneumococcal 23 valent vaccine (PNU-IMMUNE) injection 0.5 mL  0.5 mL Intramuscular  Tomorrow-1000 Herby Abraham, MD      . verapamil (ISOPTIN) 2.5 MG/ML injection           . DISCONTD: 0.9 %  sodium chloride infusion   Intravenous Continuous Prescott Parma, PA      . DISCONTD: acetaminophen (TYLENOL) tablet 650 mg  650 mg Oral Q4H PRN Prescott Parma, PA      . DISCONTD: aspirin chewable tablet 324 mg  324 mg Oral Pre-Cath Prescott Parma, PA      . DISCONTD: aspirin EC tablet 81 mg  81 mg Oral Daily Prescott Parma, [place]      . DISCONTD: atorvastatin (LIPITOR) tablet 80 mg  80 mg Oral q1800 Prescott Parma, PA      . DISCONTD: metoprolol tartrate (LOPRESSOR) tablet 12.5 mg  12.5 mg Oral QID Prescott Parma, PA   12.5 mg at 11/27/11 2245  . DISCONTD: nitroGLYCERIN (NITROGLYN) 2 % ointment 0.5 inch  0.5 inch Topical  Q6H Prescott Parma, PA      . DISCONTD: nitroGLYCERIN (NITROSTAT) SL tablet 0.3 mg  0.3 mg Sublingual Q5 min PRN Herby Abraham, MD      . DISCONTD: ondansetron [facility]) injection 4 mg  4 mg Intravenous Q6H PRN Prescott Parma, PA      . DISCONTD: sodium chloride 0.9 % injection 3 mL  3 mL Intravenous Q12H Prescott Parma, PA      . DISCONTD: sodium chloride 0.9 % injection 3 mL  3 mL Intravenous PRN Prescott Parma, PA        Allergies  Allergen Reactions  . Morphine And Related Shortness Of Breath  . Penicillins Shortness Of Breath    Past Medical History  Diagnosis Date  . Cancer     prostate  . GERD (gastroesophageal reflux disease)     Past Surgical History  Procedure Date  . Hernia repair 1970's    History reviewed. No pertinent family history.  History   Social History  . Marital Status: Divorced    Spouse Name: N/A    Number of Children: N/A  . Years of Education: N/A   Occupational History  . Not on file.   Social History Main Topics  . Smoking status: Former Smoker -- 2.0 packs/day for 40 years    Types: Cigars, Cigarettes  . Smokeless tobacco: Not on file  . Alcohol Use: 0.6 oz/week    1 Shots of liquor per week  . Drug Use: No  . Sexually Active:  No   Other Topics Concern  . Not on file   Social History Narrative  . No narrative on file    ROS: Please see the HPI.  All other systems reviewed and negative.  PHYSICAL EXAM:  BP 93/60  Pulse 64  Temp 99.4 F (37.4 C) (Oral)  Resp 17  Ht 5\' 9"  (1.753 m)  Wt 173 lb 15.1 oz (78.9 kg)  BMI 25.69 kg/m2  SpO2 95%  General: Well developed, well nourished, in no acute distress.  Alert and oriented Head:  Normocephalic and atraumatic. Neck: no JVD Lungs: Clear to auscultation and percussion. Heart: Normal S1 and S2.  No murmur, rubs or gallops.  Abdomen:  Normal bowel sounds; soft; non tender; no organomegaly Pulses: Pulses normal in all 4 extremities.  No sig hematoma on left wrist. Extremities: No clubbing or cyanosis. No edema. Neurologic: Alert and oriented x 3.  EKG:  SB.  Left axis deviation.  Anterolateral T wave changes consistent with non STEMI  Troponin 3.96.  MB 14.0    ASSESSMENT AND PLAN:   1.  Non STEMI 2.  3 Vessel CAD with reduced LV function and proximal LAD disease 4.  Advanced age 78.  CKD, stage II, improved with hydration 6.  Mild anemia   CABG planned soon by Dr. Donata Clay.  Preliminary studies in progress.  No Q waves on ECG.  Continue IV UFH and ASA. Would use 2b3a for recurrent angina Next plans per PVT Keep in unit for now.

## 2011-11-28 NOTE — Progress Notes (Signed)
ANTICOAGULATION CONSULT NOTE - Follow Up Consult  Pharmacy Consult for heparin Indication: chest pain/ACS  Allergies  Allergen Reactions  . Morphine And Related Shortness Of Breath  . Penicillins Shortness Of Breath    Patient Measurements: Height: 5\' 9"  (175.3 cm) Weight: 173 lb 15.1 oz (78.9 kg) IBW/kg (Calculated) : 70.7    Vital Signs: Temp: 98.9 F (37.2 C) (08/03 1110) Temp src: Oral (08/03 1110) BP: 94/54 mmHg (08/03 1200) Pulse Rate: 64  (08/03 0900)  Labs:  Basename 11/28/11 1237 11/28/11 1233 11/28/11 0534 11/27/11 2217 11/27/11 2121  HGB -- -- 10.9* 11.3* --  HCT -- -- 32.2* 33.7* --  PLT -- -- 252 242 --  APTT -- -- -- 37 --  LABPROT -- -- -- 14.3 --  INR -- -- -- 1.09 --  HEPARINUNFRC -- 0.45 -- -- --  CREATININE -- -- 1.12 -- --  CKTOTAL 264* -- 294* -- 400*  CKMB 9.8* -- 14.0* -- 21.9*  TROPONINI 4.22* -- 3.96* -- 7.38*    Estimated Creatinine Clearance: 50 ml/min (by C-G formula based on Cr of 1.12).   Medications:  Scheduled:    . aspirin  81 mg Oral Daily  . atorvastatin  80 mg Oral q1800  . diazepam  5 mg Oral On Call  . fentaNYL      . heparin      . lidocaine      . metoprolol tartrate  12.5 mg Oral BID  . midazolam      . nitroGLYCERIN      . pantoprazole  40 mg Oral Q1200  . pneumococcal 23 valent vaccine  0.5 mL Intramuscular Tomorrow-1000  . verapamil      . DISCONTD: aspirin  324 mg Oral Pre-Cath  . DISCONTD: aspirin EC  81 mg Oral Daily  . DISCONTD: atorvastatin  80 mg Oral q1800  . DISCONTD: metoprolol tartrate  12.5 mg Oral QID  . DISCONTD: nitroGLYCERIN  0.5 inch Topical Q6H  . DISCONTD: sodium chloride  3 mL Intravenous Q12H    Assessment: 78 yo male s/p cath on heparin and at goal (HL=0.45). Patient noted for CABG on 8/4.  Goal of Therapy:  Heparin level 0.3-0.7 units/ml Monitor platelets by anticoagulation protocol: Yes   Plan:  -No heparin changes needed -Heparin level and CBC daily  Harland German, Pharm D  11/28/2011 1:42 PM

## 2011-11-28 NOTE — Progress Notes (Signed)
1 Day Post-Op Procedure(s) (LRB): LEFT HEART CATHETERIZATION WITH CORONARY ANGIOGRAM (N/A) Subjective: Post-infarction unstable angina LV ef .30 w/o MR Carotid dopplers ok, creat 1.2, anemic hct 30 Stable on iv heparin Objective: Vital signs in last 24 hours: Temp:  [98 F (36.7 C)-99.4 F (37.4 C)] 98.9 F (37.2 C) (08/03 1110) Pulse Rate:  [52-120] 64  (08/03 0900) Cardiac Rhythm:  [-] Sinus bradycardia (08/03 1109) Resp:  [14-21] 16  (08/03 1109) BP: (88-112)/(57-69) 92/59 mmHg (08/03 1109) SpO2:  [93 %-97 %] 94 % (08/03 1110) Weight:  [173 lb 15.1 oz (78.9 kg)] 173 lb 15.1 oz (78.9 kg) (08/02 2104)  Hemodynamic parameters for last 24 hours:    Intake/Output from previous day: 08/02 0701 - 08/03 0700 In: 1346.2 [P.O.:540; I.V.:806.2] Out: 850 [Urine:850] Intake/Output this shift: Total I/O In: -  Out: 350 [Urine:350]  Lungs clear No hematoma Lab Results:  Basename 11/28/11 0534 11/27/11 2217  WBC 7.4 8.8  HGB 10.9* 11.3*  HCT 32.2* 33.7*  PLT 252 242   BMET:  Basename 11/28/11 0534  NA 140  K 3.9  CL 106  CO2 23  GLUCOSE 97  BUN 26*  CREATININE 1.12  CALCIUM 8.3*    PT/INR:  Basename 11/27/11 2217  LABPROT 14.3  INR 1.09   ABG No results found for this basename: phart, pco2, po2, hco3, tco2, acidbasedef, o2sat   CBG (last 3)  No results found for this basename: GLUCAP:3 in the last 72 hours  Assessment/Plan: S/P Procedure(s) (LRB): LEFT HEART CATHETERIZATION WITH CORONARY ANGIOGRAM (N/A) CABG in am 8-4   LOS: 1 day    VAN TRIGT III,[name] [name] 11/28/2011

## 2011-11-29 ENCOUNTER — Encounter (HOSPITAL_COMMUNITY): Payer: Self-pay | Admitting: *Deleted

## 2011-11-29 ENCOUNTER — Inpatient Hospital Stay (HOSPITAL_COMMUNITY): Payer: Medicare Other | Admitting: *Deleted

## 2011-11-29 ENCOUNTER — Inpatient Hospital Stay (HOSPITAL_COMMUNITY): Payer: Medicare Other

## 2011-11-29 ENCOUNTER — Encounter (HOSPITAL_COMMUNITY)
Admission: AD | Disposition: A | Discharge status: Home / Self Care | Payer: Self-pay | Source: Other Acute Inpatient Hospital | Attending: Cardiothoracic Surgery

## 2011-11-29 DIAGNOSIS — I519 Heart disease, unspecified: Secondary | ICD-10-CM | POA: Diagnosis not present

## 2011-11-29 DIAGNOSIS — Z951 Presence of aortocoronary bypass graft: Secondary | ICD-10-CM | POA: Diagnosis not present

## 2011-11-29 DIAGNOSIS — R079 Chest pain, unspecified: Secondary | ICD-10-CM | POA: Diagnosis not present

## 2011-11-29 DIAGNOSIS — J9819 Other pulmonary collapse: Secondary | ICD-10-CM | POA: Diagnosis not present

## 2011-11-29 DIAGNOSIS — I2510 Atherosclerotic heart disease of native coronary artery without angina pectoris: Secondary | ICD-10-CM | POA: Diagnosis not present

## 2011-11-29 DIAGNOSIS — I214 Non-ST elevation (NSTEMI) myocardial infarction: Secondary | ICD-10-CM | POA: Diagnosis not present

## 2011-11-29 DIAGNOSIS — Z01810 Encounter for preprocedural cardiovascular examination: Secondary | ICD-10-CM

## 2011-11-29 DIAGNOSIS — R918 Other nonspecific abnormal finding of lung field: Secondary | ICD-10-CM | POA: Diagnosis not present

## 2011-11-29 DIAGNOSIS — J90 Pleural effusion, not elsewhere classified: Secondary | ICD-10-CM | POA: Diagnosis not present

## 2011-11-29 DIAGNOSIS — D62 Acute posthemorrhagic anemia: Secondary | ICD-10-CM | POA: Diagnosis not present

## 2011-11-29 HISTORY — PX: CORONARY ARTERY BYPASS GRAFT: SHX141

## 2011-11-29 LAB — POCT I-STAT 3, ART BLOOD GAS (G3+)
Acid-Base Excess: 2 mmol/L (ref 0.0–2.0)
Bicarbonate: 25.2 mEq/L — ABNORMAL HIGH (ref 20.0–24.0)
Bicarbonate: 22 mEq/L (ref 20.0–24.0)
O2 Saturation: 100 %
O2 Saturation: 93 %
O2 Saturation: 97 %
Patient temperature: 34.9
TCO2: 25 mmol/L (ref 0–100)
TCO2: 26 mmol/L (ref 0–100)
TCO2: 23 mmol/L (ref 0–100)
TCO2: 23 mmol/L (ref 0–100)
pCO2 arterial: 38.2 mmHg (ref 35.0–45.0)
pCO2 arterial: 29.9 mmHg — ABNORMAL LOW (ref 35.0–45.0)
pCO2 arterial: 36.9 mmHg (ref 35.0–45.0)
pCO2 arterial: 41.7 mmHg (ref 35.0–45.0)
pH, Arterial: 7.419 (ref 7.350–7.450)
pH, Arterial: 7.415 (ref 7.350–7.450)
pH, Arterial: 7.526 — ABNORMAL HIGH (ref 7.350–7.450)
pH, Arterial: 7.385 (ref 7.350–7.450)
pH, Arterial: 7.329 — ABNORMAL LOW (ref 7.350–7.450)
pO2, Arterial: 242 mmHg — ABNORMAL HIGH (ref 80.0–100.0)
pO2, Arterial: 53 mmHg — ABNORMAL LOW (ref 80.0–100.0)
pO2, Arterial: 134 mmHg — ABNORMAL HIGH (ref 80.0–100.0)
pO2, Arterial: 97 mmHg (ref 80.0–100.0)

## 2011-11-29 LAB — MAGNESIUM: Magnesium: 3.3 mg/dL — ABNORMAL HIGH (ref 1.5–2.5)

## 2011-11-29 LAB — HEMOGLOBIN A1C
Hgb A1c MFr Bld: 5.9 % — ABNORMAL HIGH (ref <5.7)
Mean Plasma Glucose: 123 mg/dL — ABNORMAL HIGH (ref <117)

## 2011-11-29 LAB — POCT I-STAT 4, (NA,K, GLUC, HGB,HCT)
Glucose, Bld: 89 mg/dL (ref 70–99)
Glucose, Bld: 149 mg/dL — ABNORMAL HIGH (ref 70–99)
HCT: 28 % — ABNORMAL LOW (ref 39.0–52.0)
HCT: 23 % — ABNORMAL LOW (ref 39.0–52.0)
Hemoglobin: 11.2 g/dL — ABNORMAL LOW (ref 13.0–17.0)
Hemoglobin: 9.5 g/dL — ABNORMAL LOW (ref 13.0–17.0)
Hemoglobin: 7.8 g/dL — ABNORMAL LOW (ref 13.0–17.0)
Potassium: 3.9 mEq/L (ref 3.5–5.1)
Potassium: 3.6 mEq/L (ref 3.5–5.1)
Potassium: 4.8 mEq/L (ref 3.5–5.1)
Potassium: 3.6 mEq/L (ref 3.5–5.1)
Sodium: 141 mEq/L (ref 135–145)
Sodium: 139 mEq/L (ref 135–145)

## 2011-11-29 LAB — CBC
HCT: 26.1 % — ABNORMAL LOW (ref 39.0–52.0)
HCT: 23.2 % — ABNORMAL LOW (ref 39.0–52.0)
Hemoglobin: 8.9 g/dL — ABNORMAL LOW (ref 13.0–17.0)
Hemoglobin: 7.9 g/dL — ABNORMAL LOW (ref 13.0–17.0)
MCH: 30.5 pg (ref 26.0–34.0)
MCH: 30.5 pg (ref 26.0–34.0)
MCH: 30.4 pg (ref 26.0–34.0)
MCHC: 33.2 g/dL (ref 30.0–36.0)
MCHC: 34.1 g/dL (ref 30.0–36.0)
MCHC: 34.1 g/dL (ref 30.0–36.0)
MCV: 89.4 fL (ref 78.0–100.0)
MCV: 89.2 fL (ref 78.0–100.0)
Platelets: 258 10*3/uL (ref 150–400)
Platelets: 190 10*3/uL (ref 150–400)
Platelets: 176 10*3/uL (ref 150–400)
RBC: 3.94 MIL/uL — ABNORMAL LOW (ref 4.22–5.81)
RBC: 2.92 MIL/uL — ABNORMAL LOW (ref 4.22–5.81)
RBC: 2.6 MIL/uL — ABNORMAL LOW (ref 4.22–5.81)
RDW: 12.6 % (ref 11.5–15.5)
RDW: 12.7 % (ref 11.5–15.5)
WBC: 13 10*3/uL — ABNORMAL HIGH (ref 4.0–10.5)
WBC: 10.3 10*3/uL (ref 4.0–10.5)

## 2011-11-29 LAB — COMPREHENSIVE METABOLIC PANEL
ALT: 12 U/L (ref 0–53)
AST: 28 U/L (ref 0–37)
Albumin: 3.6 g/dL (ref 3.5–5.2)
Alkaline Phosphatase: 41 U/L (ref 39–117)
BUN: 20 mg/dL (ref 6–23)
CO2: 29 mEq/L (ref 19–32)
Calcium: 8.7 mg/dL (ref 8.4–10.5)
Chloride: 105 mEq/L (ref 96–112)
Creatinine, Ser: 1.13 mg/dL (ref 0.50–1.35)
GFR calc Af Amer: 67 mL/min — ABNORMAL LOW (ref >90)
GFR calc non Af Amer: 58 mL/min — ABNORMAL LOW (ref >90)
Glucose, Bld: 99 mg/dL (ref 70–99)
Potassium: 4.7 mEq/L (ref 3.5–5.1)
Sodium: 141 mEq/L (ref 135–145)
Total Bilirubin: 0.6 mg/dL (ref 0.3–1.2)
Total Protein: 7.1 g/dL (ref 6.0–8.3)

## 2011-11-29 LAB — HEPARIN LEVEL (UNFRACTIONATED): Heparin Unfractionated: 0.68 IU/mL (ref 0.30–0.70)

## 2011-11-29 LAB — HEMOGLOBIN AND HEMATOCRIT, BLOOD
HCT: 24.7 % — ABNORMAL LOW (ref 39.0–52.0)
Hemoglobin: 8.3 g/dL — ABNORMAL LOW (ref 13.0–17.0)

## 2011-11-29 LAB — APTT: aPTT: 35 seconds (ref 24–37)

## 2011-11-29 LAB — CREATININE, SERUM
Creatinine, Ser: 0.9 mg/dL (ref 0.50–1.35)
GFR calc Af Amer: 89 mL/min — ABNORMAL LOW (ref >90)
GFR calc non Af Amer: 77 mL/min — ABNORMAL LOW (ref >90)

## 2011-11-29 LAB — PROTIME-INR
INR: 1.39 (ref 0.00–1.49)
Prothrombin Time: 17.3 seconds — ABNORMAL HIGH (ref 11.6–15.2)

## 2011-11-29 LAB — POCT I-STAT, CHEM 8
BUN: 14 mg/dL (ref 6–23)
Chloride: 106 mEq/L (ref 96–112)
Creatinine, Ser: 1 mg/dL (ref 0.50–1.35)
Potassium: 4.5 mEq/L (ref 3.5–5.1)
Sodium: 139 mEq/L (ref 135–145)

## 2011-11-29 LAB — PLATELET COUNT: Platelets: 160 10*3/uL (ref 150–400)

## 2011-11-29 SURGERY — CORONARY ARTERY BYPASS GRAFTING (CABG)
Anesthesia: General | Site: Chest | Wound class: Clean

## 2011-11-29 MED ORDER — PHENYLEPHRINE HCL 10 MG/ML IJ SOLN
0.0000 ug/min | INTRAVENOUS | Status: DC
  Filled 2011-11-29 (×2): qty 2

## 2011-11-29 MED ORDER — MOXIFLOXACIN HCL IN NACL 400 MG/250ML IV SOLN
400.0000 mg | Freq: Once | INTRAVENOUS | Status: AC
  Administered 2011-11-30: 400 mg via INTRAVENOUS
  Filled 2011-11-29: qty 250

## 2011-11-29 MED ORDER — LACTATED RINGERS IV SOLN
INTRAVENOUS | Status: DC | PRN
  Administered 2011-11-29 (×3): via INTRAVENOUS

## 2011-11-29 MED ORDER — PHENYLEPHRINE HCL 10 MG/ML IJ SOLN
10.0000 mg | INTRAVENOUS | Status: DC | PRN
  Administered 2011-11-29: 1 ug/min via INTRAVENOUS

## 2011-11-29 MED ORDER — MORPHINE SULFATE 2 MG/ML IJ SOLN: 1.0000 mg | INTRAMUSCULAR | Status: DC | PRN

## 2011-11-29 MED ORDER — METOPROLOL TARTRATE 12.5 MG HALF TABLET
12.5000 mg | ORAL_TABLET | Freq: Two times a day (BID) | ORAL | Status: DC
  Administered 2011-12-03 – 2011-12-04 (×3): 12.5 mg via ORAL
  Filled 2011-11-29 (×11): qty 1

## 2011-11-29 MED ORDER — BISACODYL 5 MG PO TBEC
10.0000 mg | DELAYED_RELEASE_TABLET | Freq: Every day | ORAL | Status: DC
  Administered 2011-11-30 – 2011-12-01 (×2): 10 mg via ORAL
  Filled 2011-11-29 (×2): qty 2

## 2011-11-29 MED ORDER — MORPHINE SULFATE 2 MG/ML IJ SOLN
2.0000 mg | INTRAMUSCULAR | Status: DC | PRN
  Administered 2011-11-29: 2 mg via INTRAVENOUS
  Filled 2011-11-29: qty 1

## 2011-11-29 MED ORDER — LACTATED RINGERS IV SOLN
INTRAVENOUS | Status: DC | PRN
  Administered 2011-11-29 (×2): via INTRAVENOUS

## 2011-11-29 MED ORDER — HEPARIN SODIUM (PORCINE) 1000 UNIT/ML IJ SOLN
INTRAMUSCULAR | Status: AC
  Filled 2011-11-29: qty 1

## 2011-11-29 MED ORDER — INSULIN ASPART 100 UNIT/ML ~~LOC~~ SOLN: 0.0000 [IU] | SUBCUTANEOUS | Status: DC

## 2011-11-29 MED ORDER — DESMOPRESSIN ACETATE 4 MCG/ML IJ SOLN
20.0000 ug | INTRAMUSCULAR | Status: AC
  Filled 2011-11-29: qty 5

## 2011-11-29 MED ORDER — PROPOFOL 10 MG/ML IV BOLUS
INTRAVENOUS | Status: DC | PRN
  Administered 2011-11-29: 50 mg via INTRAVENOUS

## 2011-11-29 MED ORDER — INSULIN ASPART 100 UNIT/ML ~~LOC~~ SOLN
0.0000 [IU] | SUBCUTANEOUS | Status: DC
  Administered 2011-11-29: 2 [IU] via SUBCUTANEOUS

## 2011-11-29 MED ORDER — MAGNESIUM SULFATE 40 MG/ML IJ SOLN
4.0000 g | Freq: Once | INTRAMUSCULAR | Status: AC
  Administered 2011-11-29: 4 g via INTRAVENOUS
  Filled 2011-11-29: qty 100

## 2011-11-29 MED ORDER — HEMOSTATIC AGENTS (NO CHARGE) OPTIME
TOPICAL | Status: DC | PRN
  Administered 2011-11-29 (×2): 1 via TOPICAL

## 2011-11-29 MED ORDER — BISACODYL 10 MG RE SUPP: 10.0000 mg | Freq: Every day | RECTAL | Status: DC

## 2011-11-29 MED ORDER — VECURONIUM BROMIDE 10 MG IV SOLR
INTRAVENOUS | Status: DC | PRN
  Administered 2011-11-29 (×4): 5 mg via INTRAVENOUS

## 2011-11-29 MED ORDER — SODIUM CHLORIDE 0.9 % IJ SOLN
3.0000 mL | Freq: Two times a day (BID) | INTRAMUSCULAR | Status: DC
  Administered 2011-11-30 – 2011-12-03 (×7): 3 mL via INTRAVENOUS

## 2011-11-29 MED ORDER — DOPAMINE-DEXTROSE 3.2-5 MG/ML-% IV SOLN
2.5000 ug/kg/min | INTRAVENOUS | Status: DC
  Administered 2011-11-29: 3 ug/kg/min via INTRAVENOUS

## 2011-11-29 MED ORDER — SODIUM CHLORIDE 0.9 % IV SOLN: 250.0000 mL | INTRAVENOUS | Status: DC

## 2011-11-29 MED ORDER — DEXMEDETOMIDINE HCL IN NACL 200 MCG/50ML IV SOLN
0.1000 ug/kg/h | INTRAVENOUS | Status: DC
  Filled 2011-11-29 (×2): qty 50

## 2011-11-29 MED ORDER — SODIUM CHLORIDE 0.45 % IV SOLN
INTRAVENOUS | Status: DC
  Administered 2011-11-29: 20 mL/h via INTRAVENOUS

## 2011-11-29 MED ORDER — LACTATED RINGERS IV SOLN
INTRAVENOUS | Status: DC
  Administered 2011-11-29: 20 mL/h via INTRAVENOUS

## 2011-11-29 MED ORDER — LACTATED RINGERS IV SOLN: 500.0000 mL | Freq: Once | INTRAVENOUS | Status: AC | PRN

## 2011-11-29 MED ORDER — METOPROLOL TARTRATE 25 MG/10 ML ORAL SUSPENSION
12.5000 mg | Freq: Two times a day (BID) | ORAL | Status: DC
Start: 2011-11-29 — End: 2011-12-04
  Filled 2011-11-29 (×11): qty 5

## 2011-11-29 MED ORDER — MIDAZOLAM HCL 2 MG/2ML IJ SOLN: 2.0000 mg | INTRAMUSCULAR | Status: DC | PRN

## 2011-11-29 MED ORDER — FAMOTIDINE IN NACL 20-0.9 MG/50ML-% IV SOLN
20.0000 mg | Freq: Two times a day (BID) | INTRAVENOUS | Status: AC
  Administered 2011-11-29: 20 mg via INTRAVENOUS

## 2011-11-29 MED ORDER — SODIUM CHLORIDE 0.9 % IJ SOLN: 3.0000 mL | INTRAMUSCULAR | Status: DC | PRN

## 2011-11-29 MED ORDER — ASPIRIN 81 MG PO CHEW: 324.0000 mg | CHEWABLE_TABLET | Freq: Every day | ORAL | Status: DC

## 2011-11-29 MED ORDER — LIDOCAINE HCL (CARDIAC) 20 MG/ML IV SOLN
INTRAVENOUS | Status: AC
  Filled 2011-11-29: qty 5

## 2011-11-29 MED ORDER — ASPIRIN EC 325 MG PO TBEC
325.0000 mg | DELAYED_RELEASE_TABLET | Freq: Every day | ORAL | Status: DC
  Administered 2011-11-30 – 2011-12-04 (×5): 325 mg via ORAL
  Filled 2011-11-29 (×5): qty 1

## 2011-11-29 MED ORDER — SODIUM CHLORIDE 0.9 % IV SOLN: INTRAVENOUS | Status: DC

## 2011-11-29 MED ORDER — ACETAMINOPHEN 650 MG RE SUPP
650.0000 mg | RECTAL | Status: AC
  Administered 2011-11-29: 650 mg via RECTAL

## 2011-11-29 MED ORDER — ACETAMINOPHEN 500 MG PO TABS
1000.0000 mg | ORAL_TABLET | Freq: Four times a day (QID) | ORAL | Status: DC
  Administered 2011-11-30 – 2011-12-04 (×16): 1000 mg via ORAL
  Filled 2011-11-29 (×19): qty 2

## 2011-11-29 MED ORDER — SODIUM BICARBONATE 8.4 % IV SOLN
INTRAVENOUS | Status: AC
  Filled 2011-11-29: qty 50

## 2011-11-29 MED ORDER — LACTATED RINGERS IV SOLN
INTRAVENOUS | Status: DC | PRN
  Administered 2011-11-29: 08:00:00 via INTRAVENOUS

## 2011-11-29 MED ORDER — ROCURONIUM BROMIDE 100 MG/10ML IV SOLN
INTRAVENOUS | Status: DC | PRN
  Administered 2011-11-29: 50 mg via INTRAVENOUS

## 2011-11-29 MED ORDER — VANCOMYCIN HCL IN DEXTROSE 1-5 GM/200ML-% IV SOLN
1000.0000 mg | Freq: Once | INTRAVENOUS | Status: AC
  Administered 2011-11-29: 1000 mg via INTRAVENOUS
  Filled 2011-11-29: qty 200

## 2011-11-29 MED ORDER — SODIUM CHLORIDE 0.9 % IV SOLN
INTRAVENOUS | Status: DC
  Filled 2011-11-29: qty 1

## 2011-11-29 MED ORDER — MOXIFLOXACIN HCL IN NACL 400 MG/250ML IV SOLN
INTRAVENOUS | Status: DC | PRN
  Administered 2011-11-29: 400 mg via INTRAVENOUS

## 2011-11-29 MED ORDER — MIDAZOLAM HCL 5 MG/5ML IJ SOLN
INTRAMUSCULAR | Status: DC | PRN
  Administered 2011-11-29: 1 mg via INTRAVENOUS
  Administered 2011-11-29: 3 mg via INTRAVENOUS
  Administered 2011-11-29: 1 mg via INTRAVENOUS
  Administered 2011-11-29: 3 mg via INTRAVENOUS
  Administered 2011-11-29: 2 mg via INTRAVENOUS

## 2011-11-29 MED ORDER — ALBUMIN HUMAN 5 % IV SOLN
INTRAVENOUS | Status: AC
  Filled 2011-11-29: qty 250

## 2011-11-29 MED ORDER — LIDOCAINE HCL (CARDIAC) 20 MG/ML IV SOLN
INTRAVENOUS | Status: DC | PRN
  Administered 2011-11-29: 50 mg via INTRAVENOUS

## 2011-11-29 MED ORDER — INSULIN REGULAR BOLUS VIA INFUSION
0.0000 [IU] | Freq: Three times a day (TID) | INTRAVENOUS | Status: DC
  Filled 2011-11-29: qty 10

## 2011-11-29 MED ORDER — ALBUMIN HUMAN 5 % IV SOLN
250.0000 mL | INTRAVENOUS | Status: AC | PRN
  Administered 2011-11-29 (×3): 250 mL via INTRAVENOUS
  Filled 2011-11-29 (×2): qty 250

## 2011-11-29 MED ORDER — POTASSIUM CHLORIDE 10 MEQ/50ML IV SOLN
10.0000 meq | Freq: Once | INTRAVENOUS | Status: AC
  Administered 2011-11-29: 10 meq via INTRAVENOUS

## 2011-11-29 MED ORDER — 0.9 % SODIUM CHLORIDE (POUR BTL) OPTIME
TOPICAL | Status: DC | PRN
  Administered 2011-11-29: 1000 mL

## 2011-11-29 MED ORDER — METOPROLOL TARTRATE 1 MG/ML IV SOLN: 2.5000 mg | INTRAVENOUS | Status: DC | PRN

## 2011-11-29 MED ORDER — ONDANSETRON HCL 4 MG/2ML IJ SOLN: 4.0000 mg | Freq: Four times a day (QID) | INTRAMUSCULAR | Status: DC | PRN

## 2011-11-29 MED ORDER — ACETAMINOPHEN 160 MG/5ML PO SOLN
975.0000 mg | Freq: Four times a day (QID) | ORAL | Status: DC
Start: 2011-11-29 — End: 2011-12-04
  Administered 2011-11-29: 975 mg
  Filled 2011-11-29: qty 40.6

## 2011-11-29 MED ORDER — PROTAMINE SULFATE 10 MG/ML IV SOLN
INTRAVENOUS | Status: DC | PRN
  Administered 2011-11-29: 300 mg via INTRAVENOUS

## 2011-11-29 MED ORDER — PANTOPRAZOLE SODIUM 40 MG PO TBEC
40.0000 mg | DELAYED_RELEASE_TABLET | Freq: Every day | ORAL | Status: DC
  Administered 2011-12-01 – 2011-12-04 (×4): 40 mg via ORAL
  Filled 2011-11-29 (×3): qty 1

## 2011-11-29 MED ORDER — DOCUSATE SODIUM 100 MG PO CAPS
200.0000 mg | ORAL_CAPSULE | Freq: Every day | ORAL | Status: DC
  Administered 2011-11-30 – 2011-12-04 (×3): 200 mg via ORAL
  Filled 2011-11-29 (×4): qty 2

## 2011-11-29 MED ORDER — NITROGLYCERIN IN D5W 200-5 MCG/ML-% IV SOLN: 0.0000 ug/min | INTRAVENOUS | Status: DC

## 2011-11-29 MED ORDER — CALCIUM CARBONATE-VITAMIN D 500-200 MG-UNIT PO TABS
1.0000 | ORAL_TABLET | Freq: Every day | ORAL | Status: DC
  Administered 2011-11-30 – 2011-12-04 (×5): 1 via ORAL
  Filled 2011-11-29 (×5): qty 1

## 2011-11-29 MED ORDER — ACETAMINOPHEN 160 MG/5ML PO SOLN: 650.0000 mg | ORAL | Status: AC

## 2011-11-29 MED ORDER — SODIUM CHLORIDE 0.9 % IV SOLN
20.0000 ug | INTRAVENOUS | Status: DC | PRN
  Administered 2011-11-29: 20 ug via INTRAVENOUS

## 2011-11-29 MED ORDER — POTASSIUM CHLORIDE 10 MEQ/50ML IV SOLN
10.0000 meq | INTRAVENOUS | Status: AC
  Administered 2011-11-29 (×3): 10 meq via INTRAVENOUS

## 2011-11-29 MED ORDER — OXYCODONE HCL 5 MG PO TABS
5.0000 mg | ORAL_TABLET | ORAL | Status: DC | PRN
  Administered 2011-11-29 – 2011-12-01 (×5): 5 mg via ORAL
  Filled 2011-11-29 (×5): qty 1

## 2011-11-29 MED ORDER — DOPAMINE-DEXTROSE 3.2-5 MG/ML-% IV SOLN: 3.0000 ug/kg/min | INTRAVENOUS | Status: DC

## 2011-11-29 MED ORDER — HEPARIN SODIUM (PORCINE) 1000 UNIT/ML IJ SOLN
INTRAMUSCULAR | Status: DC | PRN
  Administered 2011-11-29: 5000 [IU] via INTRAVENOUS
  Administered 2011-11-29: 25000 [IU] via INTRAVENOUS

## 2011-11-29 MED ORDER — FENTANYL CITRATE 0.05 MG/ML IJ SOLN
INTRAMUSCULAR | Status: DC | PRN
  Administered 2011-11-29 (×3): 250 ug via INTRAVENOUS
  Administered 2011-11-29: 200 ug via INTRAVENOUS
  Administered 2011-11-29: 250 ug via INTRAVENOUS
  Administered 2011-11-29: 50 ug via INTRAVENOUS
  Administered 2011-11-29: 250 ug via INTRAVENOUS

## 2011-11-29 SURGICAL SUPPLY — 104 items
ADAPTER CARDIO PERF ANTE/RETRO (ADAPTER) ×3 IMPLANT
ATTRACTOMAT 16X20 MAGNETIC DRP (DRAPES) ×3 IMPLANT
BAG DECANTER FOR FLEXI CONT (MISCELLANEOUS) ×3 IMPLANT
BANDAGE ELASTIC 4 VELCRO ST LF (GAUZE/BANDAGES/DRESSINGS) ×3 IMPLANT
BANDAGE ELASTIC 6 VELCRO ST LF (GAUZE/BANDAGES/DRESSINGS) ×3 IMPLANT
BANDAGE GAUZE ELAST BULKY 4 IN (GAUZE/BANDAGES/DRESSINGS) ×3 IMPLANT
BASKET HEART  (ORDER IN 25'S) (MISCELLANEOUS) ×1
BASKET HEART (ORDER IN 25'S) (MISCELLANEOUS) ×1
BASKET HEART (ORDER IN 25S) (MISCELLANEOUS) ×1 IMPLANT
BENZOIN TINCTURE PRP APPL 2/3 (GAUZE/BANDAGES/DRESSINGS) ×3 IMPLANT
BLADE STERNUM SYSTEM 6 (BLADE) ×3 IMPLANT
BLADE SURG 11 STRL SS (BLADE) ×3 IMPLANT
BLADE SURG 12 STRL SS (BLADE) ×3 IMPLANT
BLADE SURG ROTATE 9660 (MISCELLANEOUS) IMPLANT
CANISTER SUCTION 2500CC (MISCELLANEOUS) ×3 IMPLANT
CANNULA AORTIC HI-FLOW 6.5M20F (CANNULA) ×3 IMPLANT
CANNULA GUNDRY RCSP 15FR (MISCELLANEOUS) ×3 IMPLANT
CANNULA VENOUS MAL SGL STG 40 (MISCELLANEOUS) IMPLANT
CANNULAE VENOUS MAL SGL STG 40 (MISCELLANEOUS)
CATH CPB KIT VANTRIGT (MISCELLANEOUS) ×3 IMPLANT
CATH ROBINSON RED A/P 18FR (CATHETERS) ×9 IMPLANT
CATH THORACIC 28FR (CATHETERS) IMPLANT
CATH THORACIC 28FR RT ANG (CATHETERS) IMPLANT
CATH THORACIC 36FR (CATHETERS) ×3 IMPLANT
CATH THORACIC 36FR RT ANG (CATHETERS) ×3 IMPLANT
CLIP TI WIDE RED SMALL 24 (CLIP) ×3 IMPLANT
CLOSURE STERI-STRIP 1/4X4 (GAUZE/BANDAGES/DRESSINGS) ×3 IMPLANT
CLOTH BEACON ORANGE TIMEOUT ST (SAFETY) ×3 IMPLANT
COVER SURGICAL LIGHT HANDLE (MISCELLANEOUS) ×3 IMPLANT
CRADLE DONUT ADULT HEAD (MISCELLANEOUS) ×3 IMPLANT
DRAIN CHANNEL 32F RND 10.7 FF (WOUND CARE) ×3 IMPLANT
DRAPE CARDIOVASCULAR INCISE (DRAPES) ×2
DRAPE SLUSH MACHINE 52X66 (DRAPES) IMPLANT
DRAPE SLUSH/WARMER DISC (DRAPES) ×3 IMPLANT
DRAPE SRG 135X102X78XABS (DRAPES) ×1 IMPLANT
DRSG COVADERM 4X14 (GAUZE/BANDAGES/DRESSINGS) ×3 IMPLANT
ELECT BLADE 4.0 EZ CLEAN MEGAD (MISCELLANEOUS) ×3
ELECT BLADE 6.5 EXT (BLADE) ×3 IMPLANT
ELECT CAUTERY BLADE 6.4 (BLADE) ×3 IMPLANT
ELECT REM PT RETURN 9FT ADLT (ELECTROSURGICAL) ×6
ELECTRODE BLDE 4.0 EZ CLN MEGD (MISCELLANEOUS) ×1 IMPLANT
ELECTRODE REM PT RTRN 9FT ADLT (ELECTROSURGICAL) ×2 IMPLANT
GLOVE BIO SURGEON STRL SZ7.5 (GLOVE) ×6 IMPLANT
GOWN STRL NON-REIN LRG LVL3 (GOWN DISPOSABLE) ×18 IMPLANT
HEMOSTAT POWDER SURGIFOAM 1G (HEMOSTASIS) ×9 IMPLANT
HEMOSTAT SURGICEL 2X14 (HEMOSTASIS) ×3 IMPLANT
INSERT FOGARTY XLG (MISCELLANEOUS) IMPLANT
KIT BASIN OR (CUSTOM PROCEDURE TRAY) ×3 IMPLANT
KIT ROOM TURNOVER OR (KITS) ×3 IMPLANT
KIT SUCTION CATH 14FR (SUCTIONS) ×3 IMPLANT
KIT VASOVIEW ACCESSORY VH 2004 (KITS) ×3 IMPLANT
KIT VASOVIEW W/TROCAR VH 2000 (KITS) ×3 IMPLANT
LEAD PACING MYOCARDI (MISCELLANEOUS) ×3 IMPLANT
MARKER GRAFT CORONARY BYPASS (MISCELLANEOUS) ×9 IMPLANT
NS IRRIG 1000ML POUR BTL (IV SOLUTION) ×15 IMPLANT
PACK OPEN HEART (CUSTOM PROCEDURE TRAY) ×3 IMPLANT
PAD ARMBOARD 7.5X6 YLW CONV (MISCELLANEOUS) ×6 IMPLANT
PENCIL BUTTON HOLSTER BLD 10FT (ELECTRODE) ×3 IMPLANT
PUNCH AORTIC ROTATE 4.0MM (MISCELLANEOUS) ×3 IMPLANT
PUNCH AORTIC ROTATE 4.5MM 8IN (MISCELLANEOUS) IMPLANT
PUNCH AORTIC ROTATE 5MM 8IN (MISCELLANEOUS) IMPLANT
SET CARDIOPLEGIA MPS 5001102 (MISCELLANEOUS) ×3 IMPLANT
SPONGE GAUZE 4X4 12PLY (GAUZE/BANDAGES/DRESSINGS) ×6 IMPLANT
SPONGE LAP 4X18 X RAY DECT (DISPOSABLE) ×3 IMPLANT
SURGIFLO W/THROMBIN 8M KIT (HEMOSTASIS) ×3 IMPLANT
SUT BONE WAX W31G (SUTURE) ×3 IMPLANT
SUT MNCRL AB 4-0 PS2 18 (SUTURE) ×3 IMPLANT
SUT PROLENE 3 0 SH DA (SUTURE) IMPLANT
SUT PROLENE 3 0 SH1 36 (SUTURE) IMPLANT
SUT PROLENE 4 0 RB 1 (SUTURE) ×8
SUT PROLENE 4 0 SH DA (SUTURE) ×12 IMPLANT
SUT PROLENE 4-0 RB1 .5 CRCL 36 (SUTURE) ×4 IMPLANT
SUT PROLENE 5 0 C 1 36 (SUTURE) ×3 IMPLANT
SUT PROLENE 6 0 C 1 30 (SUTURE) ×12 IMPLANT
SUT PROLENE 6 0 C1 VI BL (SUTURE) ×3 IMPLANT
SUT PROLENE 7 0 DA (SUTURE) IMPLANT
SUT PROLENE 7.0 RB 3 (SUTURE) ×9 IMPLANT
SUT PROLENE 8 0 BV175 6 (SUTURE) IMPLANT
SUT PROLENE BLUE 7 0 (SUTURE) ×6 IMPLANT
SUT SILK  1 MH (SUTURE)
SUT SILK 1 MH (SUTURE) IMPLANT
SUT SILK 2 0 SH CR/8 (SUTURE) IMPLANT
SUT SILK 3 0 SH CR/8 (SUTURE) IMPLANT
SUT STEEL 6MS V (SUTURE) ×6 IMPLANT
SUT STEEL STERNAL CCS#1 18IN (SUTURE) IMPLANT
SUT STEEL SZ 6 DBL 3X14 BALL (SUTURE) ×3 IMPLANT
SUT VIC AB 1 CTX 36 (SUTURE) ×4
SUT VIC AB 1 CTX36XBRD ANBCTR (SUTURE) ×2 IMPLANT
SUT VIC AB 2-0 CT1 27 (SUTURE) ×2
SUT VIC AB 2-0 CT1 TAPERPNT 27 (SUTURE) ×1 IMPLANT
SUT VIC AB 2-0 CTX 27 (SUTURE) IMPLANT
SUT VIC AB 3-0 SH 27 (SUTURE)
SUT VIC AB 3-0 SH 27X BRD (SUTURE) IMPLANT
SUT VIC AB 3-0 X1 27 (SUTURE) IMPLANT
SUT VICRYL 4-0 PS2 18IN ABS (SUTURE) IMPLANT
SUTURE E-PAK OPEN HEART (SUTURE) ×3 IMPLANT
SYSTEM SAHARA CHEST DRAIN ATS (WOUND CARE) ×3 IMPLANT
TAPE CLOTH SURG 4X10 WHT LF (GAUZE/BANDAGES/DRESSINGS) ×3 IMPLANT
TOWEL OR 17X24 6PK STRL BLUE (TOWEL DISPOSABLE) ×6 IMPLANT
TOWEL OR 17X26 10 PK STRL BLUE (TOWEL DISPOSABLE) ×6 IMPLANT
TRAY FOLEY IC TEMP SENS 14FR (CATHETERS) ×3 IMPLANT
TUBING INSUFFLATION 10FT LAP (TUBING) ×3 IMPLANT
UNDERPAD 30X30 INCONTINENT (UNDERPADS AND DIAPERS) ×3 IMPLANT
WATER STERILE IRR 1000ML POUR (IV SOLUTION) ×6 IMPLANT

## 2011-11-29 NOTE — Preoperative (Signed)
Beta Blockers   Reason not to administer Beta Blockers:Not Applicable 

## 2011-11-29 NOTE — Anesthesia Preprocedure Evaluation (Addendum)
Anesthesia Evaluation  Patient identified by MRN, date of birth, ID band Patient awake    Reviewed: Allergy & Precautions, H&P , NPO status , Patient's Chart, lab work & pertinent test results, reviewed documented beta blocker date and time   Airway Mallampati: I TM Distance: >3 FB Neck ROM: Full    Dental  (+) Teeth Intact and Dental Advisory Given   Pulmonary          Cardiovascular + CAD and + Past MI     Neuro/Psych    GI/Hepatic GERD-  Medicated and Controlled,  Endo/Other    Renal/GU      Musculoskeletal   Abdominal   Peds  Hematology   Anesthesia Other Findings   Reproductive/Obstetrics                          Anesthesia Physical Anesthesia Plan  ASA: III and Emergent  Anesthesia Plan: General   Post-op Pain Management:    Induction: Intravenous  Airway Management Planned: Oral ETT  Additional Equipment: Arterial line, CVP, PA Cath, TEE and Ultrasound Guidance Line Placement  Intra-op Plan:   Post-operative Plan: Post-operative intubation/ventilation  Informed Consent: I have reviewed the patients History and Physical, chart, labs and discussed the procedure including the risks, benefits and alternatives for the proposed anesthesia with the patient or authorized representative who has indicated his/her understanding and acceptance.   Dental advisory given  Plan Discussed with: CRNA, Surgeon and Anesthesiologist  Anesthesia Plan Comments:        Anesthesia Quick Evaluation

## 2011-11-29 NOTE — Progress Notes (Signed)
Postop urgent CABG x3 for postinfarction unstable angina  Patient remains hemodynamically stable intubated in the ICU Atrially paced at 90 per minute Minimal chest tube drainage Continue current care

## 2011-11-29 NOTE — Anesthesia Postprocedure Evaluation (Signed)
Anesthesia Post Note  Patient: [name] [name]  Procedure(s) Performed: Procedure(s) (LRB): CORONARY ARTERY BYPASS GRAFTING (CABG) (N/A)  Anesthesia type: General  Patient location: ICU  Post pain: Pain level controlled  Post assessment: Post-op Vital signs reviewed  Last Vitals:  Filed Vitals:   11/29/11 1530  BP: 103/73  Pulse: 90  Temp: 35 C  Resp: 12    Post vital signs: stable  Level of consciousness: Patient remains intubated per anesthesia plan  Complications: No apparent anesthesia complications

## 2011-11-29 NOTE — Transfer of Care (Signed)
Immediate Anesthesia Transfer of Care Note  Patient: [name] [name]  Procedure(s) Performed: Procedure(s) (LRB): CORONARY ARTERY BYPASS GRAFTING (CABG) (N/A)  Patient Location: SICU  Anesthesia Type: General  Level of Consciousness: sedated  Airway & Oxygen Therapy: Patient placed on Ventilator (see vital sign flow sheet for setting)  Post-op Assessment: Report given to PACU RN and Post -op Vital signs reviewed and stable  Post vital signs: Reviewed and stable  Complications: No apparent anesthesia complications

## 2011-11-29 NOTE — Progress Notes (Signed)
Patient had been placed to .40/4 per the cardiac wean protocol, however when changed to CP, he had frequent periods of apnea. Will place back on SIMV and allow to wake up more and will try again.

## 2011-11-29 NOTE — Brief Op Note (Addendum)
11/27/2011 - 11/29/2011  12:13 PM  PATIENT:  [name] [name]  78 y.o. male  PRE-OPERATIVE DIAGNOSIS:  Coronary artery disease, left main  POST-OPERATIVE DIAGNOSIS:  coronary artery disease  PROCEDURE:  Procedure(s): CORONARY ARTERY BYPASS GRAFTING x 3 (LIMA-LAD, SVG-Cx, SVG-RCA), EVH right thigh  SURGEON:  Surgeon(s): Kerin Perna, MD  ASSISTANT: Coral Ceo, PA-C  ANESTHESIA:   general  PATIENT CONDITION:  ICU - intubated and hemodynamically stable.  PRE-OPERATIVE WEIGHT: 79 kg

## 2011-11-29 NOTE — Plan of Care (Signed)
Problem: Phase II Progression Outcomes Goal: Patient extubated within - Outcome: Completed/Met Date Met:  11/29/11 6-12 h-- sleepy and failed first attempt

## 2011-11-29 NOTE — Progress Notes (Signed)
  Echocardiogram Echocardiogram Transesophageal has been performed.  [name] [name] 11/29/2011, 9:51 AM

## 2011-11-29 NOTE — Progress Notes (Signed)
ANTICOAGULATION CONSULT NOTE - Follow Up Consult  Pharmacy Consult for heparin Indication: chest pain/ACS  Allergies  Allergen Reactions  . Morphine And Related Shortness Of Breath  . Penicillins Shortness Of Breath    Patient Measurements: Height: 5\' 9"  (175.3 cm) Weight: 173 lb 15.1 oz (78.9 kg) IBW/kg (Calculated) : 70.7    Vital Signs: Temp: 98.1 F (36.7 C) (08/04 0334) Temp src: Oral (08/04 0334) BP: 114/75 mmHg (08/04 0600)  Labs:  Alvira Philips 11/29/11 0445 11/28/11 1237 11/28/11 1233 11/28/11 0534 11/27/11 2217 11/27/11 2121  HGB 12.0* -- -- 10.9* -- --  HCT 36.1* -- -- 32.2* 33.7* --  PLT 258 -- -- 252 242 --  APTT -- -- -- -- 37 --  LABPROT -- -- -- -- 14.3 --  INR -- -- -- -- 1.09 --  HEPARINUNFRC 0.68 -- 0.45 -- -- --  CREATININE 1.13 -- -- 1.12 -- --  CKTOTAL -- 264* -- 294* -- 400*  CKMB -- 9.8* -- 14.0* -- 21.9*  TROPONINI -- 4.22* -- 3.96* -- 7.38*    Estimated Creatinine Clearance: 49.5 ml/min (by C-G formula based on Cr of 1.13).   Medications:  Scheduled:     . aspirin  81 mg Oral Daily  . atorvastatin  80 mg Oral q1800  . bisacodyl  5 mg Oral Once  . chlorhexidine  60 mL Topical Once  . dexmedetomidine  0.1-0.7 mcg/kg/hr Intravenous To OR  . diazepam  2 mg Oral Once  . diazepam  5 mg Oral On Call  . DOPamine  2-20 mcg/kg/min Intravenous To OR  . epinephrine  0.5-20 mcg/min Intravenous To OR  . insulin (NOVOLIN-R) infusion   Intravenous To OR  . magnesium sulfate  40 mEq Other To OR  . metoprolol tartrate  12.5 mg Oral Once  . metoprolol tartrate  12.5 mg Oral BID  . moxifloxacin  400 mg Intravenous To OR  . nitroGLYCERIN  2-200 mcg/min Intravenous To OR  . nitroglycerin-nicardipine-HEPARIN-sodium bicarbonate irrigation for artery spasm   Irrigation To OR  . pantoprazole  40 mg Oral Q1200  . phenylephrine (NEO-SYNEPHRINE) Adult infusion  30-200 mcg/min Intravenous To OR  . pneumococcal 23 valent vaccine  0.5 mL Intramuscular  Tomorrow-1000  . potassium chloride  80 mEq Other To OR  . tranexamic acid  15 mg/kg Intravenous To OR  . tranexamic acid  2 mg/kg Intracatheter To OR  . tranexamic acid (CYKLOKAPRON) infusion ([facility])  1.5 mg/kg/hr Intravenous To OR  . vancomycin  1,250 mg Intravenous To OR    Assessment: 78 yo male s/p cath on heparin and at goal (HL=0.68). Patient noted for CABG today.  Goal of Therapy:  Heparin level 0.3-0.7 units/ml Monitor platelets by anticoagulation protocol: Yes   Plan:  -No heparin changes needed -Will follow post CABG  Harland German, Pharm D 11/29/2011 7:42 AM

## 2011-11-29 NOTE — Procedures (Signed)
Extubation Procedure Note  Patient Details:   Name: [name] [name] DOB: July 15, 1926 MRN: [id number]   Airway Documentation:  Airway 8 mm (Active)  Secured at (cm) 22 cm 11/29/2011  8:47 PM  Measured From Lips 11/29/2011  8:47 PM  Secured Location Right 11/29/2011  8:47 PM  Secured By Caron Presume Tape 11/29/2011  8:47 PM  Cuff Pressure (cm H2O) 24 cm H2O 11/29/2011  2:50 PM  Site Condition Dry 11/29/2011  8:47 PM    Evaluation  O2 sats: stable throughout Complications: No apparent complications Patient did tolerate procedure well. Bilateral Breath Sounds: Clear   Yes  Ave Filter 11/29/2011, 9:46 PM

## 2011-11-30 ENCOUNTER — Inpatient Hospital Stay (HOSPITAL_COMMUNITY): Payer: Medicare Other

## 2011-11-30 ENCOUNTER — Other Ambulatory Visit: Payer: Self-pay

## 2011-11-30 DIAGNOSIS — I214 Non-ST elevation (NSTEMI) myocardial infarction: Secondary | ICD-10-CM | POA: Diagnosis not present

## 2011-11-30 DIAGNOSIS — Z09 Encounter for follow-up examination after completed treatment for conditions other than malignant neoplasm: Secondary | ICD-10-CM | POA: Diagnosis not present

## 2011-11-30 DIAGNOSIS — D62 Acute posthemorrhagic anemia: Secondary | ICD-10-CM | POA: Diagnosis not present

## 2011-11-30 DIAGNOSIS — I519 Heart disease, unspecified: Secondary | ICD-10-CM | POA: Diagnosis not present

## 2011-11-30 DIAGNOSIS — J9819 Other pulmonary collapse: Secondary | ICD-10-CM | POA: Diagnosis not present

## 2011-11-30 DIAGNOSIS — J90 Pleural effusion, not elsewhere classified: Secondary | ICD-10-CM | POA: Diagnosis not present

## 2011-11-30 LAB — BASIC METABOLIC PANEL
CO2: 24 mEq/L (ref 19–32)
Calcium: 6.9 mg/dL — ABNORMAL LOW (ref 8.4–10.5)
Creatinine, Ser: 0.92 mg/dL (ref 0.50–1.35)

## 2011-11-30 LAB — POCT I-STAT, CHEM 8
BUN: 17 mg/dL (ref 6–23)
Creatinine, Ser: 1.2 mg/dL (ref 0.50–1.35)
Potassium: 4.2 mEq/L (ref 3.5–5.1)
Sodium: 137 mEq/L (ref 135–145)

## 2011-11-30 LAB — GLUCOSE, CAPILLARY
Glucose-Capillary: 128 mg/dL — ABNORMAL HIGH (ref 70–99)
Glucose-Capillary: 110 mg/dL — ABNORMAL HIGH (ref 70–99)
Glucose-Capillary: 105 mg/dL — ABNORMAL HIGH (ref 70–99)
Glucose-Capillary: 164 mg/dL — ABNORMAL HIGH (ref 70–99)
Glucose-Capillary: 127 mg/dL — ABNORMAL HIGH (ref 70–99)
Glucose-Capillary: 100 mg/dL — ABNORMAL HIGH (ref 70–99)
Glucose-Capillary: 113 mg/dL — ABNORMAL HIGH (ref 70–99)
Glucose-Capillary: 130 mg/dL — ABNORMAL HIGH (ref 70–99)
Glucose-Capillary: 114 mg/dL — ABNORMAL HIGH (ref 70–99)
Glucose-Capillary: 140 mg/dL — ABNORMAL HIGH (ref 70–99)
Glucose-Capillary: 141 mg/dL — ABNORMAL HIGH (ref 70–99)
Glucose-Capillary: 155 mg/dL — ABNORMAL HIGH (ref 70–99)

## 2011-11-30 LAB — PREPARE FRESH FROZEN PLASMA: Unit division: 0

## 2011-11-30 LAB — CBC
HCT: 24.9 % — ABNORMAL LOW (ref 39.0–52.0)
MCH: 31.1 pg (ref 26.0–34.0)
MCV: 89.3 fL (ref 78.0–100.0)
MCV: 83.3 fL (ref 78.0–100.0)
Platelets: 164 10*3/uL (ref 150–400)
Platelets: 177 10*3/uL (ref 150–400)
RBC: 2.44 MIL/uL — ABNORMAL LOW (ref 4.22–5.81)
RBC: 2.99 MIL/uL — ABNORMAL LOW (ref 4.22–5.81)
RDW: 21 % — ABNORMAL HIGH (ref 11.5–15.5)
WBC: 10.9 10*3/uL — ABNORMAL HIGH (ref 4.0–10.5)

## 2011-11-30 LAB — MAGNESIUM
Magnesium: 2.6 mg/dL — ABNORMAL HIGH (ref 1.5–2.5)
Magnesium: 2.5 mg/dL (ref 1.5–2.5)

## 2011-11-30 LAB — PREPARE PLATELET PHERESIS: Unit division: 0

## 2011-11-30 LAB — CREATININE, SERUM
GFR calc Af Amer: 74 mL/min — ABNORMAL LOW (ref >90)
GFR calc non Af Amer: 64 mL/min — ABNORMAL LOW (ref >90)

## 2011-11-30 MED ORDER — GUAIFENESIN ER 600 MG PO TB12
600.0000 mg | ORAL_TABLET | Freq: Two times a day (BID) | ORAL | Status: DC
  Administered 2011-11-30: 600 mg via ORAL
  Filled 2011-11-30 (×5): qty 1

## 2011-11-30 MED ORDER — INSULIN ASPART 100 UNIT/ML ~~LOC~~ SOLN
0.0000 [IU] | SUBCUTANEOUS | Status: DC
  Administered 2011-11-30: 2 [IU] via SUBCUTANEOUS
  Administered 2011-11-30: 4 [IU] via SUBCUTANEOUS
  Administered 2011-11-30 – 2011-12-01 (×2): 2 [IU] via SUBCUTANEOUS

## 2011-11-30 MED ORDER — AMIODARONE IV BOLUS ONLY 150 MG/100ML
150.0000 mg | Freq: Once | INTRAVENOUS | Status: AC
  Administered 2011-11-30: 150 mg via INTRAVENOUS
  Filled 2011-11-30 (×3): qty 100

## 2011-11-30 MED ORDER — AMIODARONE LOAD VIA INFUSION
150.0000 mg | Freq: Once | INTRAVENOUS | Status: DC
  Filled 2011-11-30 (×2): qty 83.34

## 2011-11-30 MED ORDER — ALBUMIN HUMAN 5 % IV SOLN
12.5000 g | Freq: Once | INTRAVENOUS | Status: AC
  Administered 2011-11-30: 12.5 g via INTRAVENOUS

## 2011-11-30 MED ORDER — CALCIUM CHLORIDE 10 % IV SOLN
1.0000 g | Freq: Once | INTRAVENOUS | Status: AC
  Administered 2011-11-30: 1 g via INTRAVENOUS
  Filled 2011-11-30: qty 10

## 2011-11-30 MED ORDER — TRAMADOL HCL 50 MG PO TABS: 50.0000 mg | ORAL_TABLET | Freq: Four times a day (QID) | ORAL | Status: DC | PRN

## 2011-11-30 MED ORDER — CALCIUM CHLORIDE 10 % IV SOLN: 1.0000 g | Freq: Once | INTRAVENOUS | Status: DC

## 2011-11-30 MED ORDER — AMIODARONE HCL 200 MG PO TABS
200.0000 mg | ORAL_TABLET | Freq: Two times a day (BID) | ORAL | Status: DC
  Filled 2011-11-30: qty 1

## 2011-11-30 MED ORDER — ALBUMIN HUMAN 5 % IV SOLN
INTRAVENOUS | Status: AC
  Administered 2011-11-30: 12.5 g
  Filled 2011-11-30: qty 250

## 2011-11-30 MED ORDER — FUROSEMIDE 10 MG/ML IJ SOLN
20.0000 mg | Freq: Once | INTRAMUSCULAR | Status: AC
  Administered 2011-11-30: 20 mg via INTRAVENOUS
  Filled 2011-11-30: qty 2

## 2011-11-30 MED ORDER — SODIUM CHLORIDE 0.9 % IV SOLN
1.0000 g | Freq: Once | INTRAVENOUS | Status: DC
  Filled 2011-11-30: qty 10

## 2011-11-30 MED ORDER — AMIODARONE HCL 200 MG PO TABS
200.0000 mg | ORAL_TABLET | Freq: Two times a day (BID) | ORAL | Status: DC
  Administered 2011-11-30 – 2011-12-04 (×8): 200 mg via ORAL
  Filled 2011-11-30 (×9): qty 1

## 2011-11-30 MED ORDER — AMIODARONE HCL IN DEXTROSE 360-4.14 MG/200ML-% IV SOLN
INTRAVENOUS | Status: AC
  Filled 2011-11-30: qty 200

## 2011-11-30 MED ORDER — FLUTICASONE PROPIONATE HFA 44 MCG/ACT IN AERO
1.0000 | INHALATION_SPRAY | Freq: Two times a day (BID) | RESPIRATORY_TRACT | Status: DC
  Administered 2011-11-30 – 2011-12-04 (×7): 1 via RESPIRATORY_TRACT
  Filled 2011-11-30 (×2): qty 10.6

## 2011-11-30 MED FILL — Potassium Chloride Inj 2 mEq/ML: INTRAVENOUS | Qty: 40 | Status: AC

## 2011-11-30 MED FILL — Magnesium Sulfate Inj 50%: INTRAMUSCULAR | Qty: 10 | Status: AC

## 2011-11-30 NOTE — Progress Notes (Addendum)
Patient has a drug allergy listed to morphine and all related derivatives with a reaction listed as "SOB". He did receive a dose at 2226 last PM safely without the listed adverse effect after patient was extubated and dangled. Called pharmacy when noted to please D/C medication d/t allergy. Did inform patient of event and he said he just "can't take the morphine because it makes him upset." Will discuss with Dr. Donata Clay in AM for alternative IV pain relief if needed. As of now he has been well controlled with oxycodone PO. Will continue to monitor.

## 2011-11-30 NOTE — Progress Notes (Signed)
Patient ID: RC AMISON, male   DOB: [birth year], 78 y.o.   MRN: [id number]   Filed Vitals:   11/30/11 1945 11/30/11 2000 11/30/11 2015 11/30/11 2019  BP: 100/60 88/60 86/49    Pulse: 95 91 82 88  Temp:      TempSrc:      Resp: 19 25 19    Height:      Weight:      SpO2: 93% 95% 94% 99%    Went into A-fib with RVR this afternoon with hypotension.  Received albumin with calcium.  On neo and dop 3.  To start on amio when up from pharmacy.  Transfused 1 unit today for anemia.   CBC    Component Value Date/Time   WBC 10.9* 11/30/2011 1520   RBC 2.99* 11/30/2011 1520   HGB 9.2* 11/30/2011 1540   HCT 27.0* 11/30/2011 1540   PLT 177 11/30/2011 1520   MCV 83.3 11/30/2011 1520   MCH 28.8 11/30/2011 1520   MCHC 34.5 11/30/2011 1520   RDW 21.0* 11/30/2011 1520    A/P:  Postop A-fib :  Start amio and observe.

## 2011-11-30 NOTE — Progress Notes (Signed)
1 Day Post-Op Procedure(s) (LRB): CORONARY ARTERY BYPASS GRAFTING (CABG) (N/A) Subjective: CABG x3 for postinfarction unstable angina Expected postoperative blood loss anemia we'll transfuse 1 unit for hemoglobin 7.6 Stable hemodynamics on low-dose dopamine and Neo-Synephrine Neuro intact Objective: Vital signs in last 24 hours: Temp:  [94.8 F (34.9 C)-99 F (37.2 C)] 99 F (37.2 C) (08/05 0800) Pulse Rate:  [70-95] 80  (08/05 0800) Cardiac Rhythm:  [-] A-V Sequential paced (08/05 0800) Resp:  [12-32] 19  (08/05 0800) BP: (79-117)/(44-75) 89/53 mmHg (08/05 0800) SpO2:  [94 %-100 %] 98 % (08/05 0800) Arterial Line BP: (64-149)/(45-80) 123/50 mmHg (08/05 0800) FiO2 (%):  [39.5 %-97.8 %] 39.5 % (08/04 2122) Weight:  [173 lb 15.1 oz (78.9 kg)-180 lb 12.4 oz (82 kg)] 180 lb 12.4 oz (82 kg) (08/05 0600)  Hemodynamic parameters for last 24 hours: PAP: (18-35)/(7-18) 22/8 mmHg CO:  [3.2 L/min-5.2 L/min] 5 L/min CI:  [1.7 L/min/m2-2.7 L/min/m2] 2.4 L/min/m2  Intake/Output from previous day: 08/04 0701 - 08/05 0700 In: 8811.7 [P.O.:300; I.V.:5893.7; Blood:1318; IV Piggyback:1300] Out: 6965 [Urine:5900; Blood:675; Chest Tube:390] Intake/Output this shift: Total I/O In: 306.7 [I.V.:56.7; IV Piggyback:250] Out: 60 [Urine:60]    Lab Results:  Basename 11/30/11 0400 11/29/11 2226 11/29/11 2103  WBC 8.8 -- 10.3  HGB 7.6* 7.1* --  HCT 21.8* 21.0* --  PLT 164 -- 176   BMET:  Basename 11/30/11 0400 11/29/11 2226 11/29/11 0445  NA 140 139 --  K 4.3 4.5 --  CL 106 106 --  CO2 24 -- 29  GLUCOSE 114* 185* --  BUN 15 14 --  CREATININE 0.92 1.00 --  CALCIUM 6.9* -- 8.7    PT/INR:  Basename 11/29/11 1500  LABPROT 17.3*  INR 1.39   ABG    Component Value Date/Time   PHART 7.329* 11/29/2011 2230   HCO3 21.9 11/29/2011 2230   TCO2 23 11/29/2011 2230   ACIDBASEDEF 4.0* 11/29/2011 2230   O2SAT 97.0 11/29/2011 2230   CBG (last 3)   Basename 11/30/11 0714 11/30/11 0606 11/30/11 0352    GLUCAP 108* 130* 113*    Assessment/Plan: S/P Procedure(s) (LRB): CORONARY ARTERY BYPASS GRAFTING (CABG) (N/A) Transfused, and diuresis and wean drips Out of bed to chair later today   LOS: 3 days    VAN TRIGT III,Aliha Diedrich 11/30/2011

## 2011-11-30 NOTE — Op Note (Signed)
NAMETORI, CUPPS NO.:  [id number]  MEDICAL RECORD NO.:  [id number]  LOCATION:  2311                         FACILITY:  [facility]  PHYSICIAN:  Kerin Perna, M.D.  DATE OF BIRTH:  November 02, 1926  DATE OF PROCEDURE: DATE OF DISCHARGE:                              OPERATIVE REPORT   OPERATION: 1. Coronary artery bypass grafting x3 (left internal mammary artery to     LAD, saphenous vein graft to obtuse marginal, saphenous vein graft     to distal right coronary artery). 2. Endoscopic harvest of right leg greater saphenous vein.  PREOPERATIVE DIAGNOSIS:  Postinfarction unstable angina with severe three-vessel coronary artery disease.  POSTOPERATIVE DIAGNOSIS:  Postinfarction unstable angina with severe three-vessel coronary artery disease.  SURGEON:  Kerin Perna, MD  ASSISTANT:  Coral Ceo, PA-C  ANESTHESIA:  General, Kaylyn Layer. Michelle Piper, MD  INDICATIONS:  The patient is an 78 year old gentleman with ongoing chest pain over the past 2 weeks, which became extremely symptomatic and intense with increasing in severity and frequency including some nocturnal symptoms.  He was evaluated at [facility] were his cardiac enzymes were positive and he was transferred to [facility] for cardiac catheterizations by Dr. Riley Kill.  This demonstrated 95% stenosis to the proximal LAD, 70-80% ostial RCA and 70% proximal circumflex. Ventriculogram was not performed due to elevated BUN and creatinine, but an echo showed anterior wall hypokinesia with EF of 35%, mild MR.  He was placed on heparin and his cardiac enzymes were tracked and declined. His chest x-ray was consistent with chronic pulmonary fibrosis and his carotid Dopplers showed no significant disease.  He was prepared for urgent multivessel bypass grafting.  I discussed the procedure in detail with the patient and his family before surgery including the indications, the alternatives to surgical therapy for his  severe coronary artery disease in the expected postoperative hospital recovery. I reviewed the risks including a 1-2% risk of severe morbidity, mortality, and he demonstrated his understanding and agreed to proceed with the surgery under what I felt was an informed consent.  OPERATIVE FINDINGS: 1. Adequate conduit. 2. Anterior wall hypokinesia, improved, following placement of bypass     grafts. 3. Intraoperative coagulopathy requiring FFP and platelets with     improved coagulation function. 4. Chronic lung disease noted intraoperatively.  PROCEDURE:  The patient was brought to the operating room and placed supine on the operating table where general anesthesia was induced.  A transesophageal echo probe was placed by the anesthesiologist.  The chest, abdomen, and legs were prepped with Betadine and draped as a sterile field.  A proper time-out was performed.  A sternal incision was made as the saphenous vein was harvested endoscopically from the right leg.  The left internal mammary artery was harvested as a pedicle graft from its origin at the subclavian vessels.  It was somewhat thickened, but it had adequate flow.  The sternal retractor was placed and the pericardium was opened and suspended.  Pursestrings were placed in the ascending aorta and right atrium, and after the vein had been harvested, the patient was systemically heparinized and cannulated and placed on cardiopulmonary bypass.  The coronary arteries were  identified for grafting, and the mammary artery and vein grafts were prepared for the distal anastomoses.  Cardioplegia cannulas were placed for both antegrade and retrograde cold blood cardioplegia.  The patient was cooled to 32 degrees.  The aortic crossclamp was applied.  One liter of cold blood cardioplegia was delivered in split doses between the antegrade aortic and retrograde coronary sinus catheters. There was good cardioplegic arrest and septal temp dropped  less than 12 degrees.  Cardioplegia was delivered every 20 minutes.  The distal coronary anastomoses were then performed.  The first distal anastomosis was to the distal RCA.  This was a 1.5-mm vessel with proximal ostial 70% stenosis.  A reverse saphenous vein was sewn end-to- side with running 7-0 Prolene with good flow through the graft.  The second distal anastomosis was to the OM branch of the left coronary artery.  This was a 1.5-mm vessel with proximal 75% stenosis.  A reverse saphenous vein was sewn end-to-side with running 7-0 Prolene with good flow through the graft.  Cardioplegia was redosed.  The third distal anastomosis was to the distal third of the LAD.  The LAD had a 95-99% proximal stenosis, but it also had a 70% midvessel stenosis.  The graft was placed beyond the midvessel stenosis.  The LAD was 1.4-1.5 mm in diameter and the end-to-side anastomosis with the mammary artery was constructed with running 8-0 Prolene.  There was good flow through the anastomosis after briefly releasing the pedicle bulldog on the mammary artery.  The bulldog was reapplied and the pedicle was secured the epicardium with 6-0 Prolene.  While the peripheral crossclamp was still in place, two proximal vein anastomoses were performed on the ascending aorta using a 4.0-mm punch with running 7-0 Prolene.  Air was vented from the coronary arteries with a dose of retrograde warm blood cardioplegia.  The crossclamp was removed.  The heart was cardioverted back to a regular rhythm.  The grafts were de- aired and opened, and each had good flow.  Hemostasis was documented at the proximal and distal anastomoses.  Temporary pacing wires were applied, and the patient was rewarmed to 37 degrees.  The lungs were re- expanded.  The patient was then weaned off bypass on low-dose dopamine. A 2D echo showed improved anterior wall function.  Protamine was administered.  There was a transient drop in systemic  pressures and this recovered using some Neo-Synephrine and Valium.  Following reversal of heparin, there were still heparin activity detected; however, rather than giving more protamine, I elected to give some FFP and platelets. This improved the coagulation function.  The mediastinum was irrigated and the superior pericardium was closed.  The anterior mediastinal and a left pleural chest tube were placed and brought out through separate incisions.  The sternum was closed with interrupted steel wire.  The pectoralis fascia was closed using running #1 Vicryl.  The subcutaneous and skin layers were closed using running Vicryl, and sterile dressings were applied.  Total cardiopulmonary bypass time was 114 minutes.     Kerin Perna, M.D.     PV/MEDQ  D:  11/29/2011  T:  11/30/2011  Job:  [id number]  cc:   Arturo Morton. Riley Kill, MD, [facility]

## 2011-12-01 ENCOUNTER — Inpatient Hospital Stay (HOSPITAL_COMMUNITY): Payer: Medicare Other

## 2011-12-01 DIAGNOSIS — J9819 Other pulmonary collapse: Secondary | ICD-10-CM | POA: Diagnosis not present

## 2011-12-01 DIAGNOSIS — I2510 Atherosclerotic heart disease of native coronary artery without angina pectoris: Secondary | ICD-10-CM | POA: Diagnosis not present

## 2011-12-01 DIAGNOSIS — J90 Pleural effusion, not elsewhere classified: Secondary | ICD-10-CM | POA: Diagnosis not present

## 2011-12-01 LAB — GLUCOSE, CAPILLARY
Glucose-Capillary: 108 mg/dL — ABNORMAL HIGH (ref 70–99)
Glucose-Capillary: 118 mg/dL — ABNORMAL HIGH (ref 70–99)
Glucose-Capillary: 122 mg/dL — ABNORMAL HIGH (ref 70–99)
Glucose-Capillary: 123 mg/dL — ABNORMAL HIGH (ref 70–99)
Glucose-Capillary: 130 mg/dL — ABNORMAL HIGH (ref 70–99)
Glucose-Capillary: 133 mg/dL — ABNORMAL HIGH (ref 70–99)

## 2011-12-01 LAB — BASIC METABOLIC PANEL
BUN: 19 mg/dL (ref 6–23)
CO2: 26 mEq/L (ref 19–32)
Calcium: 6.9 mg/dL — ABNORMAL LOW (ref 8.4–10.5)
Chloride: 102 mEq/L (ref 96–112)
Creatinine, Ser: 1.11 mg/dL (ref 0.50–1.35)
GFR calc Af Amer: 69 mL/min — ABNORMAL LOW (ref >90)
GFR calc non Af Amer: 59 mL/min — ABNORMAL LOW (ref >90)
Glucose, Bld: 135 mg/dL — ABNORMAL HIGH (ref 70–99)
Potassium: 3.9 mEq/L (ref 3.5–5.1)
Sodium: 136 mEq/L (ref 135–145)

## 2011-12-01 LAB — CBC
HCT: 23.4 % — ABNORMAL LOW (ref 39.0–52.0)
HCT: 22.9 % — ABNORMAL LOW (ref 39.0–52.0)
Hemoglobin: 7.9 g/dL — ABNORMAL LOW (ref 13.0–17.0)
Hemoglobin: 7.8 g/dL — ABNORMAL LOW (ref 13.0–17.0)
MCH: 28.2 pg (ref 26.0–34.0)
MCH: 28.5 pg (ref 26.0–34.0)
MCHC: 33.8 g/dL (ref 30.0–36.0)
MCHC: 34.1 g/dL (ref 30.0–36.0)
MCV: 83.6 fL (ref 78.0–100.0)
MCV: 83.6 fL (ref 78.0–100.0)
Platelets: 155 10*3/uL (ref 150–400)
Platelets: 148 10*3/uL — ABNORMAL LOW (ref 150–400)
RBC: 2.8 MIL/uL — ABNORMAL LOW (ref 4.22–5.81)
RBC: 2.74 MIL/uL — ABNORMAL LOW (ref 4.22–5.81)
RDW: 20.8 % — ABNORMAL HIGH (ref 11.5–15.5)
RDW: 20.2 % — ABNORMAL HIGH (ref 11.5–15.5)
WBC: 11.6 10*3/uL — ABNORMAL HIGH (ref 4.0–10.5)
WBC: 9.9 10*3/uL (ref 4.0–10.5)

## 2011-12-01 MED ORDER — FUROSEMIDE 40 MG PO TABS
40.0000 mg | ORAL_TABLET | Freq: Every day | ORAL | Status: DC
  Administered 2011-12-01 – 2011-12-04 (×4): 40 mg via ORAL
  Filled 2011-12-01 (×4): qty 1

## 2011-12-01 MED ORDER — POTASSIUM CHLORIDE 10 MEQ/50ML IV SOLN
INTRAVENOUS | Status: AC
  Administered 2011-12-01: 10 meq
  Filled 2011-12-01: qty 150

## 2011-12-01 MED ORDER — DOPAMINE-DEXTROSE 3.2-5 MG/ML-% IV SOLN
2.0000 ug/kg/min | INTRAVENOUS | Status: DC
  Administered 2011-12-01: 3 ug/kg/min via INTRAVENOUS
  Filled 2011-12-01: qty 250

## 2011-12-01 MED ORDER — POTASSIUM CHLORIDE 10 MEQ/50ML IV SOLN
10.0000 meq | INTRAVENOUS | Status: AC
  Administered 2011-12-01 (×2): 10 meq via INTRAVENOUS

## 2011-12-01 MED ORDER — POLYSACCHARIDE IRON COMPLEX 150 MG PO CAPS
150.0000 mg | ORAL_CAPSULE | Freq: Every day | ORAL | Status: DC
  Administered 2011-12-01 – 2011-12-04 (×4): 150 mg via ORAL
  Filled 2011-12-01 (×4): qty 1

## 2011-12-01 MED FILL — Heparin Sodium (Porcine) Inj 1000 Unit/ML: INTRAMUSCULAR | Qty: 30 | Status: AC

## 2011-12-01 MED FILL — Sodium Chloride IV Soln 0.9%: INTRAVENOUS | Qty: 1000 | Status: AC

## 2011-12-01 MED FILL — Lidocaine HCl IV Inj 20 MG/ML: INTRAVENOUS | Qty: 10 | Status: AC

## 2011-12-01 MED FILL — Sodium Bicarbonate IV Soln 8.4%: INTRAVENOUS | Qty: 50 | Status: AC

## 2011-12-01 MED FILL — Heparin Sodium (Porcine) Inj 1000 Unit/ML: INTRAMUSCULAR | Qty: 10 | Status: AC

## 2011-12-01 MED FILL — Mannitol IV Soln 20%: INTRAVENOUS | Qty: 500 | Status: AC

## 2011-12-01 MED FILL — Sodium Chloride Irrigation Soln 0.9%: Qty: 3000 | Status: AC

## 2011-12-01 MED FILL — Electrolyte-R (PH 7.4) Solution: INTRAVENOUS | Qty: 4000 | Status: AC

## 2011-12-01 NOTE — Progress Notes (Signed)
2 Days Post-Op Procedure(s) (LRB): CORONARY ARTERY BYPASS GRAFTING (CABG) (N/A) Subjective: CABG x 3 MI post op afib w/ low BP now NSR a-pacing  Objective: Vital signs in last 24 hours: Temp:  [97.8 F (36.6 C)-99.6 F (37.6 C)] 99.1 F (37.3 C) (08/06 0815) Pulse Rate:  [62-98] 65  (08/06 0800) Cardiac Rhythm:  [-] Normal sinus rhythm (08/06 0800) Resp:  [15-28] 26  (08/06 0630) BP: (65-122)/(32-70) 108/60 mmHg (08/06 0800) SpO2:  [82 %-99 %] 95 % (08/06 0828) Arterial Line BP: (77-145)/(35-63) 77/41 mmHg (08/05 1800) FiO2 (%):  [28 %] 28 % (08/05 2019) Weight:  [186 lb 1.1 oz (84.4 kg)] 186 lb 1.1 oz (84.4 kg) (08/06 0500)  Hemodynamic parameters for last 24 hours: PAP: (21-25)/(7-10) 22/7 mmHg  Intake/Output from previous day: 08/05 0701 - 08/06 0700 In: 2673.4 [P.O.:550; I.V.:871.4; Blood:400; IV Piggyback:852] Out: 1575 [Urine:1485; Chest Tube:90] Intake/Output this shift: Total I/O In: 44.4 [I.V.:44.4] Out: 90 [Urine:30; Chest Tube:60]  Neuro intact Lungs clear  Lab Results:  Basename 12/01/11 0430 11/30/11 1540 11/30/11 1520  WBC 11.6* -- 10.9*  HGB 7.9* 9.2* --  HCT 23.4* 27.0* --  PLT 155 -- 177   BMET:  Basename 12/01/11 0430 11/30/11 1540 11/30/11 0400  NA 136 137 --  K 3.9 4.2 --  CL 102 103 --  CO2 26 -- 24  GLUCOSE 135* 144* --  BUN 19 17 --  CREATININE 1.11 1.20 --  CALCIUM 6.9* -- 6.9*    PT/INR:  Basename 11/29/11 1500  LABPROT 17.3*  INR 1.39   ABG    Component Value Date/Time   PHART 7.329* 11/29/2011 2230   HCO3 21.9 11/29/2011 2230   TCO2 21 11/30/2011 1540   ACIDBASEDEF 4.0* 11/29/2011 2230   O2SAT 97.0 11/29/2011 2230   CBG (last 3)   Basename 12/01/11 0813 12/01/11 0421 12/01/11 0007  GLUCAP 123* 122* 118*    Assessment/Plan: S/P Procedure(s) (LRB): CORONARY ARTERY BYPASS GRAFTING (CABG) (N/A) Postop anemia- follow tx circle after dopa off   LOS: 4 days    VAN TRIGT III,PETER 12/01/2011

## 2011-12-01 NOTE — Evaluation (Signed)
Clinical/Bedside Swallow Evaluation Patient Details  Name: [name] [name] MRN: [id number] Date of Birth: March 12, 1927  Today's Date: 12/01/2011 Time: 4098-1191 SLP Time Calculation (min): 15 min  Past Medical History:  Past Medical History  Diagnosis Date  . Cancer     prostate  . GERD (gastroesophageal reflux disease)    Past Surgical History:  Past Surgical History  Procedure Date  . Hernia repair 1970's   HPI:  The patient is an 78 year old gentleman with ongoing chest pain over the past 2 weeks, Underwent CABG x3 on 11/30/11.His chest x-ray was consistent with chronic pulmonary fibrosis. Pt wife reported fear that pt may choke and a history of difficulty swallowing to staff.    Assessment / Plan / Recommendation Clinical Impression  Pt presents with a normal oral and oropharyngeal swallow function. No signs of aspriation or overt dysphagia. Pt consumed 100% of breakfast with no difficulty. Pt reports that he has had episdoes of regurgitation of his food when he feels like it is stuck. He reports that happens rarely, no more than once a month. He also reports occasional GER but does not take medication for this. Suggest pt continue current diet, consider discussing GER with primary MD is symptoms persist. SLP offered education regarding esophageal precautions.     Aspiration Risk  None    Diet Recommendation Regular;Thin liquid   Liquid Administration via: Cup;Straw Medication Administration: Whole meds with liquid Supervision: Patient able to self feed Compensations: Slow rate;Small sips/bites;Follow solids with liquid Postural Changes and/or Swallow Maneuvers: Seated upright 90 degrees;Upright 30-60 min after meal    Other  Recommendations Oral Care Recommendations: Oral care BID   Follow Up Recommendations  None    Frequency and Duration        Pertinent Vitals/Pain NA    SLP Swallow Goals     Swallow Study Prior Functional Status       General HPI: The patient  is an 78 year old gentleman with ongoing chest pain over the past 2 weeks, Underwent CABG x3 on 11/30/11.His chest x-ray was consistent with chronic pulmonary fibrosis. Pt wife reported fear that pt may choke and a history of difficulty swallowing to staff.  Type of Study: Bedside swallow evaluation Diet Prior to this Study: Regular;Thin liquids Temperature Spikes Noted: No Respiratory Status: Supplemental O2 delivered via (comment) History of Recent Intubation: Yes Length of Intubations (days): 1 days Date extubated: 11/29/11 Behavior/Cognition: Alert;Cooperative;Pleasant mood Oral Cavity - Dentition: Adequate natural dentition Self-Feeding Abilities: Able to feed self Patient Positioning: Upright in chair Baseline Vocal Quality: Clear Volitional Cough: Strong Volitional Swallow: Able to elicit    Oral/Motor/Sensory Function Overall Oral Motor/Sensory Function: Appears within functional limits for tasks assessed   Ice Chips     Thin Liquid Thin Liquid: Within functional limits Presentation: Cup;Self Fed;Straw    Nectar Thick Nectar Thick Liquid: Not tested   Honey Thick Honey Thick Liquid: Not tested   Puree Puree: Not tested   Solid Solid: Within functional limits    Ander Wamser, Riley Nearing 12/01/2011,9:16 AM

## 2011-12-01 NOTE — Evaluation (Signed)
Physical Therapy Evaluation Patient Details Name: [name] [name] MRN: [id number] DOB: 06-Jun-1926 Today's Date: 12/01/2011 Time: 0160-1093 PT Time Calculation (min): 33 min  PT Assessment / Plan / Recommendation Clinical Impression  pt s/p CABG x3 with MVR.  Pt mobilized well while maintaining acceptable sats and HR in lower 100's.  Pt can banefit from HHPT after D/C    PT Assessment  Patient needs continued PT services    Follow Up Recommendations  Home health PT    Barriers to Discharge        Equipment Recommendations       Recommendations for Other Services     Frequency Min 3X/week    Precautions / Restrictions Precautions Precautions: Sternal   Pertinent Vitals/Pain       Mobility  Bed Mobility Bed Mobility: Sit to Supine Sit to Supine: 4: Min assist;HOB flat Details for Bed Mobility Assistance: vc's for technique to decr need for UE assist Transfers Transfers: Sit to Stand;Stand to Sit Sit to Stand: 4: Min assist;From chair/3-in-1 Stand to Sit: 4: Min guard;To bed Details for Transfer Assistance: Reinforced sternal precautions and min lifting needed from low surface Ambulation/Gait Ambulation/Gait Assistance: 4: Min guard Ambulation Distance (Feet): 300 Feet Assistive device: Rolling walker Ambulation/Gait Assistance Details: generally steady Gait Pattern: Within Functional Limits Stairs: No    Exercises     PT Diagnosis: Other (comment) (decr activity tolerance)  PT Problem List: Decreased activity tolerance;Decreased knowledge of precautions;Decreased mobility PT Treatment Interventions: Gait training;Stair training;Functional mobility training;Therapeutic activities;Patient/family education   PT Goals Acute Rehab PT Goals PT Goal Formulation: With patient Time For Goal Achievement: 12/08/11 Potential to Achieve Goals: Good Pt will go Supine/Side to Sit: with supervision PT Goal: Supine/Side to Sit - Progress: Goal set today Pt will go Sit to Stand:  with supervision PT Goal: Sit to Stand - Progress: Goal set today Pt will Transfer Bed to Chair/Chair to Bed: with supervision PT Transfer Goal: Bed to Chair/Chair to Bed - Progress: Goal set today Pt will Ambulate: >150 feet;with supervision PT Goal: Ambulate - Progress: Goal set today Pt will Go Up / Down Stairs: 3-5 stairs;with supervision;with rail(s) PT Goal: Up/Down Stairs - Progress: Goal set today  Visit Information  Last PT Received On: 12/01/11 Assistance Needed: +1 (lots of lines)    Subjective Data  Subjective: I'm I and still working  Patient Stated Goal: Back Independent   Prior Functioning  Home Living Lives With: Spouse Available Help at Discharge: Family Type of Home: House Home Access: Stairs to enter Secretary/administrator of Steps: several Entrance Stairs-Rails: Right;Left Home Layout: Two level Alternate Level Stairs-Number of Steps: flight Alternate Level Stairs-Rails: Right;Left Bathroom Shower/Tub: Walk-in shower;Tub/shower unit Bathroom Toilet: Standard Home Adaptive Equipment: None Additional Comments: could borrow any devices needed Prior Function Level of Independence: Independent Driving: Yes Vocation: Part time employment Communication Communication: No difficulties    Cognition  Overall Cognitive Status: Appears within functional limits for tasks assessed/performed Arousal/Alertness: Awake/alert Orientation Level: Appears intact for tasks assessed Behavior During Session: [facility] for tasks performed    Extremity/Trunk Assessment Right Lower Extremity Assessment RLE ROM/Strength/Tone: Within functional levels Left Lower Extremity Assessment LLE ROM/Strength/Tone: Within functional levels Trunk Assessment Trunk Assessment: Normal   Balance    End of Session PT - End of Session Activity Tolerance: Patient tolerated treatment well Patient left: in bed;with call bell/phone within reach;with family/visitor present Nurse Communication:  Mobility status  GP     Kanaya Gunnarson, Eliseo Gum 12/01/2011, 4:13 PM  12/01/2011  Pomeroy Bing, PT [phone] (pager)

## 2011-12-02 ENCOUNTER — Encounter (HOSPITAL_COMMUNITY): Payer: Self-pay | Admitting: Cardiothoracic Surgery

## 2011-12-02 LAB — BASIC METABOLIC PANEL
BUN: 22 mg/dL (ref 6–23)
CO2: 24 mEq/L (ref 19–32)
Calcium: 6.8 mg/dL — ABNORMAL LOW (ref 8.4–10.5)
Chloride: 100 mEq/L (ref 96–112)
Creatinine, Ser: 1 mg/dL (ref 0.50–1.35)
GFR calc Af Amer: 78 mL/min — ABNORMAL LOW (ref >90)
GFR calc non Af Amer: 67 mL/min — ABNORMAL LOW (ref >90)
Glucose, Bld: 127 mg/dL — ABNORMAL HIGH (ref 70–99)
Potassium: 3.9 mEq/L (ref 3.5–5.1)
Sodium: 131 mEq/L — ABNORMAL LOW (ref 135–145)

## 2011-12-02 LAB — TYPE AND SCREEN
ABO/RH(D): AB POS
Antibody Screen: NEGATIVE
Unit division: 0
Unit division: 0

## 2011-12-02 LAB — CBC
HCT: 24.8 % — ABNORMAL LOW (ref 39.0–52.0)
Hemoglobin: 8.6 g/dL — ABNORMAL LOW (ref 13.0–17.0)
MCH: 29 pg (ref 26.0–34.0)
MCHC: 34.7 g/dL (ref 30.0–36.0)
MCV: 83.5 fL (ref 78.0–100.0)
Platelets: 137 10*3/uL — ABNORMAL LOW (ref 150–400)
RBC: 2.97 MIL/uL — ABNORMAL LOW (ref 4.22–5.81)
RDW: 18.9 % — ABNORMAL HIGH (ref 11.5–15.5)
WBC: 9.8 10*3/uL (ref 4.0–10.5)

## 2011-12-02 MED ORDER — ATORVASTATIN CALCIUM 20 MG PO TABS
20.0000 mg | ORAL_TABLET | Freq: Every day | ORAL | Status: DC
  Administered 2011-12-02 – 2011-12-03 (×2): 20 mg via ORAL
  Filled 2011-12-02 (×3): qty 1

## 2011-12-02 MED ORDER — PHENYLEPHRINE HCL 10 MG/ML IJ SOLN
10.0000 ug/min | INTRAVENOUS | Status: DC
  Filled 2011-12-02: qty 1

## 2011-12-02 MED ORDER — SODIUM CHLORIDE 0.9 % IV SOLN
INTRAVENOUS | Status: DC
  Administered 2011-12-02: 20 mL/h via INTRAVENOUS

## 2011-12-02 NOTE — Progress Notes (Signed)
Physical Therapy Treatment Patient Details Name: [name] [name] MRN: [id number] DOB: June 04, 1926 Today's Date: 12/02/2011 Time: 7829-5621 PT Time Calculation (min): 21 min  PT Assessment / Plan / Recommendation Comments on Treatment Session  Patient s/p CABG. Patient making steady progress.     Follow Up Recommendations  Home health PT    Barriers to Discharge        Equipment Recommendations       Recommendations for Other Services    Frequency Min 3X/week   Plan Discharge plan remains appropriate;Frequency remains appropriate    Precautions / Restrictions Precautions Precautions: Sternal Type of Shoulder Precautions: Patient able to recall all precautions   Pertinent Vitals/Pain     Mobility  Bed Mobility Bed Mobility: Sit to Supine Sit to Supine: 4: Min assist;HOB flat Details for Bed Mobility Assistance: A with LEs as patient states he feels more of a pull in his chest when attempting to do on his own.  Transfers Sit to Stand: 4: Min assist;From chair/3-in-1 Stand to Sit: 4: Min assist;To chair/3-in-1 Details for Transfer Assistance: Patient needed assistance to lift bottom from recliners low surface. Able to stand with more ease from 3n1 Ambulation/Gait Ambulation/Gait Assistance: 4: Min guard Ambulation Distance (Feet): 300 Feet Assistive device: Rolling walker Ambulation/Gait Assistance Details: Cues to decrease weight through UEs and look forward. Patient gait steady Gait Pattern: Within Functional Limits    Exercises     PT Diagnosis:    PT Problem List:   PT Treatment Interventions:     PT Goals Acute Rehab PT Goals PT Goal: Sit to Stand - Progress: Progressing toward goal PT Transfer Goal: Bed to Chair/Chair to Bed - Progress: Progressing toward goal PT Goal: Ambulate - Progress: Progressing toward goal  Visit Information  Last PT Received On: 12/02/11 Assistance Needed: +1    Subjective Data      Cognition  Overall Cognitive Status: Appears  within functional limits for tasks assessed/performed Arousal/Alertness: Awake/alert Orientation Level: Appears intact for tasks assessed Behavior During Session: [facility] for tasks performed    Balance     End of Session PT - End of Session Equipment Utilized During Treatment: Gait belt Activity Tolerance: Patient tolerated treatment well Patient left: in bed;with call bell/phone within reach Nurse Communication: Mobility status   GP     Fredrich Birks 12/02/2011, 1:29 PM 12/02/2011 Fredrich Birks PTA [phone] pager [phone] office

## 2011-12-03 ENCOUNTER — Inpatient Hospital Stay (HOSPITAL_COMMUNITY): Payer: Medicare Other

## 2011-12-03 DIAGNOSIS — J9819 Other pulmonary collapse: Secondary | ICD-10-CM | POA: Diagnosis not present

## 2011-12-03 DIAGNOSIS — J90 Pleural effusion, not elsewhere classified: Secondary | ICD-10-CM | POA: Diagnosis not present

## 2011-12-03 DIAGNOSIS — J811 Chronic pulmonary edema: Secondary | ICD-10-CM | POA: Diagnosis not present

## 2011-12-03 LAB — BASIC METABOLIC PANEL
BUN: 25 mg/dL — ABNORMAL HIGH (ref 6–23)
CO2: 22 mEq/L (ref 19–32)
Calcium: 7 mg/dL — ABNORMAL LOW (ref 8.4–10.5)
Chloride: 105 mEq/L (ref 96–112)
Creatinine, Ser: 1.29 mg/dL (ref 0.50–1.35)
GFR calc Af Amer: 57 mL/min — ABNORMAL LOW (ref >90)
GFR calc non Af Amer: 50 mL/min — ABNORMAL LOW (ref >90)
Glucose, Bld: 121 mg/dL — ABNORMAL HIGH (ref 70–99)
Potassium: 3.7 mEq/L (ref 3.5–5.1)
Sodium: 137 mEq/L (ref 135–145)

## 2011-12-03 LAB — CBC
HCT: 24.9 % — ABNORMAL LOW (ref 39.0–52.0)
Hemoglobin: 8.7 g/dL — ABNORMAL LOW (ref 13.0–17.0)
MCH: 29.2 pg (ref 26.0–34.0)
MCHC: 34.9 g/dL (ref 30.0–36.0)
MCV: 83.6 fL (ref 78.0–100.0)
Platelets: 178 10*3/uL (ref 150–400)
RBC: 2.98 MIL/uL — ABNORMAL LOW (ref 4.22–5.81)
RDW: 18.5 % — ABNORMAL HIGH (ref 11.5–15.5)
WBC: 8.2 10*3/uL (ref 4.0–10.5)

## 2011-12-03 MED ORDER — LEVALBUTEROL HCL 0.63 MG/3ML IN NEBU
0.6300 mg | INHALATION_SOLUTION | Freq: Three times a day (TID) | RESPIRATORY_TRACT | Status: DC
  Administered 2011-12-03 (×2): 0.63 mg via RESPIRATORY_TRACT
  Filled 2011-12-03 (×4): qty 3

## 2011-12-03 MED ORDER — LEVALBUTEROL HCL 0.63 MG/3ML IN NEBU
0.6300 mg | INHALATION_SOLUTION | Freq: Four times a day (QID) | RESPIRATORY_TRACT | Status: DC | PRN
  Filled 2011-12-03: qty 3

## 2011-12-03 NOTE — Progress Notes (Signed)
4 Days Post-Op Procedure(s) (LRB): CORONARY ARTERY BYPASS GRAFTING (CABG) (N/A)  Subjective:  [name] [name] has no complaints this morning. He was evaluated by PT who recommends Home PT.  +BM since surgery  Objective: Vital signs in last 24 hours: Temp:  [97.8 F (36.6 C)-98.4 F (36.9 C)] 97.8 F (36.6 C) (08/08 0504) Pulse Rate:  [62-90] 64  (08/08 0504) Cardiac Rhythm:  [-] Normal sinus rhythm (08/07 1940) Resp:  [17-24] 18  (08/08 0504) BP: (83-113)/(47-62) 113/62 mmHg (08/08 0504) SpO2:  [91 %-96 %] 91 % (08/08 0504) Weight:  [184 lb 15.5 oz (83.9 kg)] 184 lb 15.5 oz (83.9 kg) (08/08 0504)  Intake/Output from previous day: 08/07 0701 - 08/08 0700 In: 666.1 [P.O.:600; I.V.:66.1] Out: 1893 [Urine:1890; Stool:3]  General appearance: alert, cooperative and no distress Heart: regular rate and rhythm Lungs: diminished breath sounds bibasilar Abdomen: soft, non-tender; bowel sounds normal; no masses,  no organomegaly Extremities: edema trace Wound: clean and dry  Lab Results:  Basename 12/02/11 0400 12/01/11 2000  WBC 9.8 9.9  HGB 8.6* 7.8*  HCT 24.8* 22.9*  PLT 137* 148*   BMET:  Basename 12/02/11 0400 12/01/11 0430  NA 131* 136  K 3.9 3.9  CL 100 102  CO2 24 26  GLUCOSE 127* 135*  BUN 22 19  CREATININE 1.00 1.11  CALCIUM 6.8* 6.9*    PT/INR: No results found for this basename: LABPROT,INR in the last 72 hours ABG    Component Value Date/Time   PHART 7.329* 11/29/2011 2230   HCO3 21.9 11/29/2011 2230   TCO2 21 11/30/2011 1540   ACIDBASEDEF 4.0* 11/29/2011 2230   O2SAT 97.0 11/29/2011 2230   CBG (last 3)   Basename 12/01/11 2036 12/01/11 1542 12/01/11 1140  GLUCAP 133* 130* 108*    Assessment/Plan: S/P Procedure(s) (LRB): CORONARY ARTERY BYPASS GRAFTING (CABG) (N/A)  1. CV- NSR, patient not requiring A-Pacing, rate in the 60s and pressure controlled on Lopressor 2. Pulm- bilateral atelectasis, pleural effusions continue IS/Lasix 3. Acute post operative anemia-  improving, on iron 4. Volume Overload- patients weight up 5kg since admission, will continue Lasix 5. Dispo- if patient maintains NSR today, will D/C EPW in AM, start nebulizers for atelectasis, if patient progressess likely d/c over the weekend   LOS: 6 days    Raford Pitcher, ERIN 12/03/2011

## 2011-12-03 NOTE — Care Management Note (Signed)
    Page 1 of 2   12/04/2011     1:14:13 PM   CARE MANAGEMENT NOTE 12/04/2011  Patient:  [name] [name], [name] [name]   Account Number:  [id number]  Date Initiated:  12/01/2011  Documentation initiated by:  Anzal Bartnick  Subjective/Objective Assessment:   PT S/P CABG X 3 ON 11/29/11.  PTA, PT INDEPENDENT, LIVES WITH DAUGHTER.     Action/Plan:   MET WITH PT TO DISCUSS DC PLANS.  DAUGHTERS TO PROVIDE 24HR CARE AT DISCHARGE.  WILL FOLLOW FOR HOME NEEDS AS PT PROGRESSES.   Anticipated DC Date:  12/05/2011   Anticipated DC Plan:  HOME W HOME HEALTH SERVICES      DC Planning Services  CM consult      [facility] Choice  HOME HEALTH   Choice offered to / List presented to:  C-1 Patient        HH arranged  HH-1 RN      [facility] agency  [facility]   Status of service:  Completed, signed off Medicare Important Message given?   (If response is "NO", the following Medicare IM given date fields will be blank) Date Medicare IM given:   Date Additional Medicare IM given:    Discharge Disposition:  HOME W HOME HEALTH SERVICES  Per UR Regulation:  Reviewed for med. necessity/level of care/duration of stay  If discussed at Long Length of Stay Meetings, dates discussed:   12/03/2011    Comments:  12/04/11 Makendra Vigeant,RN,BSN [phone] PT FOR POSSIBLE DC LATER TODAY.  NEEDS HHRN FOR RESTORATIVE CARE.  AMBULATING WELL CURRENTLY; NO HHPT NEEDED.  PT REQUESTS [facility] FOR HH NEEDS.  REFERRAL TO DEBBIE WITH [facility]. START OF CARE 24-48H POST DC DATE.  PT HAS ACCESS TO RW, BSC IF NEEDED.

## 2011-12-03 NOTE — Progress Notes (Signed)
CARDIAC REHAB PHASE I   PRE:  Rate/Rhythm: Sinus 67  BP:    Sitting: 110/64    SaO2: 95% Room Air  MODE:  Ambulation: 315 ft   POST:  Rate/Rhythem: 71  BP:   Sitting: 124/60    SaO2: 88% Room air 94% on Room Air after rest  1415- 1445 Patient ambulated in the hallway using rolling walker with assistance times one. Mr. Gavidia's gait was steady and he walked rapidly in the hallway. Mr Estrella complained of shortness of breath upon return to the room. Advised patient to walk at a slower pace when he walks this evening. Patient encouraged to use incentive spirometer. Patient assisted back  To chair with family member present.  Mr Courtney's shortness of breath resolved. Will follow up with patient tomorrow.  Harlon Flor, Arta Bruce

## 2011-12-04 ENCOUNTER — Inpatient Hospital Stay (HOSPITAL_COMMUNITY): Payer: Medicare Other

## 2011-12-04 DIAGNOSIS — R918 Other nonspecific abnormal finding of lung field: Secondary | ICD-10-CM | POA: Diagnosis not present

## 2011-12-04 DIAGNOSIS — D649 Anemia, unspecified: Secondary | ICD-10-CM | POA: Diagnosis not present

## 2011-12-04 DIAGNOSIS — I2510 Atherosclerotic heart disease of native coronary artery without angina pectoris: Secondary | ICD-10-CM

## 2011-12-04 DIAGNOSIS — D62 Acute posthemorrhagic anemia: Secondary | ICD-10-CM | POA: Diagnosis not present

## 2011-12-04 DIAGNOSIS — J90 Pleural effusion, not elsewhere classified: Secondary | ICD-10-CM | POA: Diagnosis not present

## 2011-12-04 DIAGNOSIS — I519 Heart disease, unspecified: Secondary | ICD-10-CM | POA: Diagnosis not present

## 2011-12-04 DIAGNOSIS — Z951 Presence of aortocoronary bypass graft: Secondary | ICD-10-CM

## 2011-12-04 DIAGNOSIS — I214 Non-ST elevation (NSTEMI) myocardial infarction: Secondary | ICD-10-CM | POA: Diagnosis not present

## 2011-12-04 DIAGNOSIS — J9819 Other pulmonary collapse: Secondary | ICD-10-CM | POA: Diagnosis not present

## 2011-12-04 MED ORDER — FLUTICASONE PROPIONATE HFA 44 MCG/ACT IN AERO: 1.0000 | INHALATION_SPRAY | Freq: Two times a day (BID) | RESPIRATORY_TRACT | Status: DC

## 2011-12-04 MED ORDER — LISINOPRIL 2.5 MG PO TABS: 2.5000 mg | ORAL_TABLET | Freq: Every day | ORAL | Status: DC

## 2011-12-04 MED ORDER — TRAMADOL HCL 50 MG PO TABS: 50.0000 mg | ORAL_TABLET | Freq: Four times a day (QID) | ORAL | Status: AC | PRN

## 2011-12-04 MED ORDER — ASPIRIN 325 MG PO TBEC: 325.0000 mg | DELAYED_RELEASE_TABLET | Freq: Every day | ORAL | Status: AC

## 2011-12-04 MED ORDER — FUROSEMIDE 40 MG PO TABS: 40.0000 mg | ORAL_TABLET | Freq: Every day | ORAL | Status: DC

## 2011-12-04 MED ORDER — ATORVASTATIN CALCIUM 20 MG PO TABS: 20.0000 mg | ORAL_TABLET | Freq: Every day | ORAL | Status: DC

## 2011-12-04 MED ORDER — POTASSIUM CHLORIDE ER 10 MEQ PO TBCR: 10.0000 meq | EXTENDED_RELEASE_TABLET | Freq: Two times a day (BID) | ORAL | Status: DC

## 2011-12-04 MED ORDER — AMIODARONE HCL 200 MG PO TABS: 200.0000 mg | ORAL_TABLET | Freq: Two times a day (BID) | ORAL | Status: DC

## 2011-12-04 MED ORDER — POLYSACCHARIDE IRON COMPLEX 150 MG PO CAPS: 150.0000 mg | ORAL_CAPSULE | Freq: Every day | ORAL | Status: DC

## 2011-12-04 MED ORDER — METOPROLOL TARTRATE 25 MG PO TABS: 12.5000 mg | ORAL_TABLET | Freq: Two times a day (BID) | ORAL | Status: DC

## 2011-12-04 NOTE — Progress Notes (Signed)
[name] [name] completed and requests his name be sent to [facility].  [id number] Ethelda Chick CES, ACSM

## 2011-12-04 NOTE — Progress Notes (Addendum)
5 Days Post-Op Procedure(s) (LRB): CORONARY ARTERY BYPASS GRAFTING (CABG) (N/A)  Subjective: [name] [name] complains of his nebulizer treatments stating that make him feel weird and very hyper.  Objective: Vital signs in last 24 hours: Temp:  [98.2 F (36.8 C)-98.5 F (36.9 C)] 98.2 F (36.8 C) (08/09 0544) Pulse Rate:  [63-73] 68  (08/09 0544) Cardiac Rhythm:  [-] Normal sinus rhythm (08/08 1940) Resp:  [17-18] 18  (08/09 0544) BP: (98-127)/(59-78) 127/78 mmHg (08/09 0544) SpO2:  [93 %-98 %] 95 % (08/09 0544) Weight:  [180 lb 1.9 oz (81.7 kg)] 180 lb 1.9 oz (81.7 kg) (08/09 0544)  Intake/Output from previous day: 08/08 0701 - 08/09 0700 In: 480 [P.O.:480] Out: 251 [Urine:250; Stool:1]  General appearance: alert, cooperative and no distress Heart: regular rate and rhythm Lungs: clear to auscultation bilaterally Abdomen: soft, non-tender; bowel sounds normal; no masses,  no organomegaly Extremities: edema trace Wound: clean and dry  Lab Results:  Basename 12/03/11 0855 12/02/11 0400  WBC 8.2 9.8  HGB 8.7* 8.6*  HCT 24.9* 24.8*  PLT 178 137*   BMET:  Basename 12/03/11 0855 12/02/11 0400  NA 137 131*  K 3.7 3.9  CL 105 100  CO2 22 24  GLUCOSE 121* 127*  BUN 25* 22  CREATININE 1.29 1.00  CALCIUM 7.0* 6.8*    PT/INR: No results found for this basename: LABPROT,INR in the last 72 hours ABG    Component Value Date/Time   PHART 7.329* 11/29/2011 2230   HCO3 21.9 11/29/2011 2230   TCO2 21 11/30/2011 1540   ACIDBASEDEF 4.0* 11/29/2011 2230   O2SAT 97.0 11/29/2011 2230   CBG (last 3)   Basename 12/01/11 2036 12/01/11 1542 12/01/11 1140  GLUCAP 133* 130* 108*    Assessment/Plan: S/P Procedure(s) (LRB): CORONARY ARTERY BYPASS GRAFTING (CABG) (N/A)  1. CV- NSR, amiodarone, lopressor 2. Pulm- bilateral pleural effusions, atelectasis continue lasix, patient no longer wants nebulizer treatments, will add flutter valve 3. Acute blood loss anemia- stable 4. Volume Overload-  continue diuresis 5. Dispo- patient progressing well, will d/c EPW this morning, will not be discharging patient on ACE or ARB due to renal insufficiency can readdress at later date if creatinine improves.  Will plan for discharge in the next 24 hours   LOS: 7 days    [name], [name] 12/04/2011   patient examined and medical record reviewed,agree with above note. OK to DC home with [facility] today [name] [name],[name] [name] 12/04/2011  patient examined and medical record reviewed,agree with above note. [name] [name],[name] [name] 12/13/2011

## 2011-12-04 NOTE — Discharge Summary (Signed)
Physician Discharge Summary  Patient ID: [name] [name] MRN: [id number] DOB/AGE: 08-04-1926 78 y.o.  Admit date: 11/27/2011 Discharge date: 12/04/2011  Admission Diagnoses:  Patient Active Problem List  Diagnosis  . Non-STEMI (non-ST elevated myocardial infarction)  . CAD (coronary artery disease)  . Anemia  . S/P CABG x 3   Discharge Diagnoses:   Patient Active Problem List  Diagnosis  . Non-STEMI (non-ST elevated myocardial infarction)  . CAD (coronary artery disease)  . Anemia  . S/P CABG x 3   Discharged Condition: good  History of Present Illness:   [name] [name] is a 78 yo white male with no known history of CAD.  Approximately 2 weeks ago he was awoken from sleep with new onset of chest pain.  The episode lasted approximately 20 minutes and was associated with some diaphoresis.  The patient felt like it was occasional reflux symptoms.  However on 11/27/2011 the patient was again woken from sleep with severe chest pain.  This was again associated with diaphoresis and nausea.  This episode lasted approximately 1 hour in duration. He presented to [facility] where workup revealed a NSTEMI with elevated cardiac enzymes.  The patient was subsequently transferred to [facility] for further evaluation.    Hospital Course:   Upon arrival the patient was taken for urgent cardiac catheterization.  This was performed and revealed multivessel CAD with some severe LAD stenosis and LM involvement.  Due to these findings the patient was admitted to ICU and placed on a heparin drip.  TCTS was consulted for possible coronary bypass procedure.  Dr. Donata Clay evaluated the patient and felt that he would infact be a candidate for coronary bypass.  The risks and benefits were explained to the patient and he was agreeable to proceed with surgery.  This was tentatively set up to occur in the next 36-48 hours pending patients stability.  HD #0 patient underwent 2D ECHO which showed anteroseptal  hypokinesis and some myocardial thickening.  HD #1 the patient remained chest pain free.  Preop carotid study did not reveal disease requiring surgical intervention. HD #2  The patient remained stable.  He was taken to the operating room and underwent CABG x3 utilizing LIMA to LAD, SVG to Left Circumflex, and SVG to RCA.  He also underwent endoscopic saphenous vein harvest of the right thigh.  The patient tolerated the procedure well and was transferred to the SICU in stable condition.  POD #0 the patient was extubated.  POD #1 the patient was transfused 1 unit of packed red blood cells for acute post operative blood loss anemia.  He was weaned off dopamine and Neo-Synephrine as tolerated. The patient developed Atrial Fibrillation with rapid ventricular response.  He was treated with Amiodarone with successful conversion to NSR.  His chest tubes were removed without difficulty.  POD #2 patient was being atrially paced.  He remained in NSR.  His chest xray did not reveal any evidence of pneumothorax.  There was bilateral atelectasis.  POD #3  He was transferred to the step down unit.  POD #5 the patient's pacing wires were removed without difficulty.  If no further issues arise the patient will be stable for discharge home today or in the next 24 hours.     Significant Diagnostic Studies: cardiac catheterization  AO 98/70 (82)  LV not done   Coronary angiography:   Coronary dominance: right   Left main stem: ostial 50-60% narrowing without hemodynamic obstruction   Left anterior descending (  LAD): Takes a sharp turn at the proximal takeoff followed by a 90% eccentric ruptured plaque. There is 50% eccentric narrowing after the large diagonal. The mid vessel has a calcified 80% lesion in the mid distal junction. There is diffuse luminal irregularities in the diagonal.   Left circumflex (LCx): provides a small, diffusely irregular OM1. The AV circ just after the OM 1 has segmental 60% narrowing. The OM is  large a graftable.   Right coronary artery (RCA): Large caliber vessel with ostial narrowing of 60-70% and calcified. Just after this, there is a [place] RCA with mild diffuse luminal irregularities. The PDA and PLA are relatively large vessels.  Left ventriculography: Not done due to renal insufficiency.  Treatments: surgery:   Coronary artery bypass grafting x3 (left internal mammary artery to LAD, saphenous vein graft to obtuse marginal, saphenous vein graft to distal right coronary artery).  Disposition: Home  Discharge Orders    Future Appointments: Provider: Department: Dept Phone: Center:   12/30/2011 2:00 PM Kerin Perna, MD [facility] [place] [phone] [facility]     Medication List  As of 12/04/2011 10:51 AM   STOP taking these medications         ibuprofen 200 MG tablet         TAKE these medications         amiodarone 200 MG tablet   Commonly known as: PACERONE   Take 1 tablet (200 mg total) by mouth 2 (two) times daily.      aspirin 325 MG EC tablet   Take 1 tablet (325 mg total) by mouth daily.      atorvastatin 20 MG tablet   Commonly known as: LIPITOR   Take 1 tablet (20 mg total) by mouth daily at 6 PM.      calcium-vitamin D 500-200 MG-UNIT per tablet   Commonly known as: OSCAL WITH D   Take 1 tablet by mouth daily.      docusate sodium 100 MG capsule   Commonly known as: COLACE   Take 100 mg by mouth daily as needed. For stool softner      fluticasone 44 MCG/ACT inhaler   Commonly known as: FLOVENT HFA   Inhale 1 puff into the lungs 2 (two) times daily.      furosemide 40 MG tablet   Commonly known as: LASIX   Take 1 tablet (40 mg total) by mouth daily. For 7 days      iron polysaccharides 150 MG capsule   Commonly known as: NIFEREX   Take 1 capsule (150 mg total) by mouth daily.      metoprolol tartrate 25 MG tablet   Commonly known as: LOPRESSOR   Take 0.5 tablets (12.5 mg total) by mouth 2 (two) times daily.      multivitamin with  minerals Tabs   Take 1 tablet by mouth daily.      traMADol 50 MG tablet   Commonly known as: ULTRAM   Take 1 tablet (50 mg total) by mouth every 6 (six) hours as needed.           Follow-up Information    Follow up with VAN Dinah Beers, MD on 12/30/2011. (Appointment is at 2:00pm)    Contact information:   [address] [place] [address] [phone]       Follow up with [facility] on 12/30/2011. (Please get chest xray performed at 1:00pm)    Contact information:   [address]  [facility]  [phone]      Schedule an appointment as soon as possible for a visit with Shawnie Pons, MD. (Please contact and make appointment for 2 weeks from hospital discharge date)    Contact information:   [address] [place] [address] [phone]          Signed: Lowella Dandy 12/04/2011, 10:51 AM

## 2011-12-04 NOTE — Progress Notes (Signed)
Physical Therapy Treatment Patient Details Name: [name] [name] MRN: [id number] DOB: 05/13/1926 Today's Date: 12/04/2011 Time: 7829-5621 PT Time Calculation (min): 14 min  PT Assessment / Plan / Recommendation Comments on Treatment Session  Pt admitted s/p CABG with external pacer wires pulled this am.  Pt able to tolerate increased ambulation distance today without any issues.  Motivated.    Follow Up Recommendations  Home health PT    Barriers to Discharge        Equipment Recommendations  None recommended by PT    Recommendations for Other Services    Frequency Min 3X/week   Plan Discharge plan remains appropriate;Frequency remains appropriate    Precautions / Restrictions Precautions Precautions: Sternal Type of Shoulder Precautions: Patient able to recall all precautions Restrictions Weight Bearing Restrictions: No   Pertinent Vitals/Pain None    Mobility  Bed Mobility Bed Mobility: Supine to Sit Supine to Sit: 6: Modified independent (Device/Increase time) Details for Bed Mobility Assistance: Increased time. Transfers Transfers: Sit to Stand;Stand to Sit Sit to Stand: 4: Min guard;From bed Stand to Sit: 4: Min guard;To bed Details for Transfer Assistance: Guarding for balance with pt sure not to use bilateral UEs during sit to/from stand. Ambulation/Gait Ambulation/Gait Assistance: 4: Min guard Ambulation Distance (Feet): 400 Feet Assistive device: None Ambulation/Gait Assistance Details: Guarding for balance with cues for tall posture and safety. Gait Pattern: Decreased trunk rotation;Trunk flexed Stairs: No Wheelchair Mobility Wheelchair Mobility: No    Exercises     PT Diagnosis:    PT Problem List:   PT Treatment Interventions:     PT Goals Acute Rehab PT Goals PT Goal Formulation: With patient Time For Goal Achievement: 12/08/11 Potential to Achieve Goals: Good PT Goal: Supine/Side to Sit - Progress: Met PT Goal: Sit to Stand - Progress:  Progressing toward goal PT Goal: Ambulate - Progress: Progressing toward goal  Visit Information  Last PT Received On: 12/04/11 Assistance Needed: +1    Subjective Data  Subjective: "You sure you can keep up?" Patient Stated Goal: Back Independent   Cognition  Overall Cognitive Status: Appears within functional limits for tasks assessed/performed Arousal/Alertness: Awake/alert Orientation Level: Appears intact for tasks assessed Behavior During Session: [facility] for tasks performed    Balance  Balance Balance Assessed: No  End of Session PT - End of Session Equipment Utilized During Treatment: Gait belt Activity Tolerance: Patient tolerated treatment well Patient left: in bed;with call bell/phone within reach;with family/visitor present (Sitting EOB.) Nurse Communication: Mobility status   GP     Cephus Shelling 12/04/2011, 1:35 PM  12/04/2011 Cephus Shelling, PT, DPT [phone]

## 2011-12-04 NOTE — Progress Notes (Addendum)
Subjective:  He looks much improved.  Feels good.  Has been ambulating without difficulty  Objective:  Vital Signs in the last 24 hours: Temp:  [98.2 F (36.8 C)-98.5 F (36.9 C)] 98.2 F (36.8 C) (08/09 0544) Pulse Rate:  [63-73] 68  (08/09 0544) Resp:  [17-18] 18  (08/09 0544) BP: (98-127)/(59-78) 127/78 mmHg (08/09 0544) SpO2:  [93 %-98 %] 94 % (08/09 0901) Weight:  [180 lb 1.9 oz (81.7 kg)] 180 lb 1.9 oz (81.7 kg) (08/09 0544)  Intake/Output from previous day: 08/08 0701 - 08/09 0700 In: 480 [P.O.:480] Out: 251 [Urine:250; Stool:1]   Physical Exam: General: Thin older gentleman in no acute distress, somewhat pale.   Head:  Normocephalic and atraumatic. Lungs: Clear to auscultation and percussion anteriorly.  Lying flat after wire removal.   Heart: Normal S1 and S2.  S4 gallop.   Extremities: No clubbing or cyanosis. No edema. Neurologic: Alert and oriented x 3.    Lab Results:  Basename 12/03/11 0855 12/02/11 0400  WBC 8.2 9.8  HGB 8.7* 8.6*  PLT 178 137*    Basename 12/03/11 0855 12/02/11 0400  NA 137 131*  K 3.7 3.9  CL 105 100  CO2 22 24  GLUCOSE 121* 127*  BUN 25* 22  CREATININE 1.29 1.00   No results found for this basename: TROPONINI:2,CK,MB:2 in the last 72 hours Hepatic Function Panel No results found for this basename: PROT,ALBUMIN,AST,ALT,ALKPHOS,BILITOT,BILIDIR,IBILI in the last 72 hours No results found for this basename: CHOL in the last 72 hours No results found for this basename: PROTIME in the last 72 hours  Imaging: Dg Chest 2 View  12/04/2011  *RADIOLOGY REPORT*  Clinical Data: Shortness of breath, atelectasis, CABG  CHEST - 2 VIEW  Comparison: 12/03/2011; 12/01/2011; 11/30/2011; 11/27/2011  Findings:  Grossly unchanged cardiac silhouette and mediastinal contours with atherosclerotic calcifications within a mildly tortuous thoracic aorta. Epicardial electrical wires persist.  Post median sternotomy and CABG.  The lungs remain hyperinflated  with prominence of the pulmonary interstitium, in particular about the peripheral aspects of the bilateral lungs.  Unchanged thickening along the right minor fissure.  No definite evidence of pulmonary edema.  Grossly unchanged trace bilateral pleural effusions.  No pneumothorax. Unchanged bones including a severe compression deformity of a lower thoracic vertebral body.  IMPRESSION: 1.  Grossly unchanged small bilateral effusions and bibasilar opacities, likely atelectasis. 2.  Chronic bronchitic change.  Original Report Authenticated By: Waynard Reeds, M.D.   Dg Chest 2 View  12/03/2011  **ADDENDUM** CREATED: 12/03/2011 07:55:16  T12 anterior wedge compression fracture noted on the 11/27/11 examination without change.  **END ADDENDUM** SIGNED BY: Almedia Balls. Constance Goltz, M.D.   12/03/2011  *RADIOLOGY REPORT*  Clinical Data: Mild chest pain post bypass.  CHEST - 2 VIEW  Comparison: 12/01/2011.  Findings: Post CABG.  Pulmonary vascular congestion/mild pulmonary edema superimposed upon chronic changes.  Small pleural effusion.  Right central line removed.  No pneumothorax.  Cardiomegaly.  Calcified tortuous aorta.  Basilar subsegmental atelectasis.  IMPRESSION: Pulmonary vascular congestion/mild pulmonary edema superimposed upon chronic changes.  Small pleural effusion.  Basilar subsegmental atelectasis.  Cardiomegaly.  Calcified tortuous aorta.  Original Report Authenticated By: Fuller Canada, M.D.       Assessment/Plan:  Patient Active Hospital Problem List: Non-STEMI (non-ST elevated myocardial infarction) (11/28/2011)   Assessment: stable post MI with severe CAD post bypass   Plan: continue ASA, beta blockers CAD (coronary artery disease) (11/28/2011)   Assessment: see above   Plan: as  noted Anemia (11/28/2011)   Assessment: stable above 8   Plan: should rise with time S/P CABG x 3 (12/04/2011)   Assessment: stable.  Looking good   Plan: ok Atrial fibrillation   He has not had recurrence.  Would use amio  at bid dose for a few more days, then once daily.  After this, I would think it could be stopped in follow up.  Given patients size and rising BUN he may not need current dose of furosemide as well--?20mg   Dr. Diona Browner in our [facility] clinic agreed to follow him.  Follow up echo down the road will be helpful.        Shawnie Pons, MD, [facility], FSCAI 12/04/2011, 10:15 AM

## 2011-12-04 NOTE — Progress Notes (Signed)
EPW removed per protocol and as ordered with all ends intact. No ectopy noted. Patient tolerated well. Patient reminded to lay supine for one hour;  Verbalized understanding. Will continue to monitor. Call bell near. Mamie Levers

## 2011-12-05 NOTE — Discharge Summary (Signed)
patient examined and medical record reviewed,agree with above note. VAN TRIGT III,PETER 12/05/2011    

## 2011-12-06 DIAGNOSIS — N189 Chronic kidney disease, unspecified: Secondary | ICD-10-CM | POA: Diagnosis not present

## 2011-12-06 DIAGNOSIS — Z48812 Encounter for surgical aftercare following surgery on the circulatory system: Secondary | ICD-10-CM | POA: Diagnosis not present

## 2011-12-06 DIAGNOSIS — I214 Non-ST elevation (NSTEMI) myocardial infarction: Secondary | ICD-10-CM | POA: Diagnosis not present

## 2011-12-06 DIAGNOSIS — I2510 Atherosclerotic heart disease of native coronary artery without angina pectoris: Secondary | ICD-10-CM | POA: Diagnosis not present

## 2011-12-06 DIAGNOSIS — Z8546 Personal history of malignant neoplasm of prostate: Secondary | ICD-10-CM | POA: Diagnosis not present

## 2011-12-09 DIAGNOSIS — I2510 Atherosclerotic heart disease of native coronary artery without angina pectoris: Secondary | ICD-10-CM | POA: Diagnosis not present

## 2011-12-09 DIAGNOSIS — Z48812 Encounter for surgical aftercare following surgery on the circulatory system: Secondary | ICD-10-CM | POA: Diagnosis not present

## 2011-12-09 DIAGNOSIS — Z8546 Personal history of malignant neoplasm of prostate: Secondary | ICD-10-CM | POA: Diagnosis not present

## 2011-12-09 DIAGNOSIS — N189 Chronic kidney disease, unspecified: Secondary | ICD-10-CM | POA: Diagnosis not present

## 2011-12-09 DIAGNOSIS — I214 Non-ST elevation (NSTEMI) myocardial infarction: Secondary | ICD-10-CM | POA: Diagnosis not present

## 2011-12-15 DIAGNOSIS — Z8546 Personal history of malignant neoplasm of prostate: Secondary | ICD-10-CM | POA: Diagnosis not present

## 2011-12-15 DIAGNOSIS — I2510 Atherosclerotic heart disease of native coronary artery without angina pectoris: Secondary | ICD-10-CM | POA: Diagnosis not present

## 2011-12-15 DIAGNOSIS — N189 Chronic kidney disease, unspecified: Secondary | ICD-10-CM | POA: Diagnosis not present

## 2011-12-15 DIAGNOSIS — I214 Non-ST elevation (NSTEMI) myocardial infarction: Secondary | ICD-10-CM | POA: Diagnosis not present

## 2011-12-15 DIAGNOSIS — Z48812 Encounter for surgical aftercare following surgery on the circulatory system: Secondary | ICD-10-CM | POA: Diagnosis not present

## 2011-12-17 DIAGNOSIS — N189 Chronic kidney disease, unspecified: Secondary | ICD-10-CM | POA: Diagnosis not present

## 2011-12-17 DIAGNOSIS — Z8546 Personal history of malignant neoplasm of prostate: Secondary | ICD-10-CM | POA: Diagnosis not present

## 2011-12-17 DIAGNOSIS — I2510 Atherosclerotic heart disease of native coronary artery without angina pectoris: Secondary | ICD-10-CM | POA: Diagnosis not present

## 2011-12-17 DIAGNOSIS — I214 Non-ST elevation (NSTEMI) myocardial infarction: Secondary | ICD-10-CM | POA: Diagnosis not present

## 2011-12-17 DIAGNOSIS — Z48812 Encounter for surgical aftercare following surgery on the circulatory system: Secondary | ICD-10-CM | POA: Diagnosis not present

## 2011-12-18 ENCOUNTER — Telehealth: Payer: Self-pay | Admitting: *Deleted

## 2011-12-18 ENCOUNTER — Encounter: Payer: Self-pay | Admitting: Nurse Practitioner

## 2011-12-18 ENCOUNTER — Encounter: Payer: Self-pay | Admitting: *Deleted

## 2011-12-18 ENCOUNTER — Ambulatory Visit (INDEPENDENT_AMBULATORY_CARE_PROVIDER_SITE_OTHER): Payer: Medicare Other | Admitting: Nurse Practitioner

## 2011-12-18 ENCOUNTER — Ambulatory Visit
Admission: RE | Admit: 2011-12-18 | Discharge: 2011-12-18 | Disposition: A | Discharge status: Home / Self Care | Payer: Medicare Other | Source: Ambulatory Visit | Attending: Nurse Practitioner | Admitting: Nurse Practitioner

## 2011-12-18 VITALS — BP 118/70 | HR 68 | Ht 69.0 in | Wt 175.0 lb

## 2011-12-18 DIAGNOSIS — R918 Other nonspecific abnormal finding of lung field: Secondary | ICD-10-CM | POA: Diagnosis not present

## 2011-12-18 DIAGNOSIS — R0602 Shortness of breath: Secondary | ICD-10-CM | POA: Diagnosis not present

## 2011-12-18 DIAGNOSIS — I519 Heart disease, unspecified: Secondary | ICD-10-CM

## 2011-12-18 DIAGNOSIS — I214 Non-ST elevation (NSTEMI) myocardial infarction: Secondary | ICD-10-CM | POA: Diagnosis not present

## 2011-12-18 DIAGNOSIS — Z951 Presence of aortocoronary bypass graft: Secondary | ICD-10-CM

## 2011-12-18 DIAGNOSIS — I517 Cardiomegaly: Secondary | ICD-10-CM | POA: Diagnosis not present

## 2011-12-18 DIAGNOSIS — D649 Anemia, unspecified: Secondary | ICD-10-CM

## 2011-12-18 LAB — CBC WITH DIFFERENTIAL/PLATELET
Basophils Absolute: 0.1 10*3/uL (ref 0.0–0.1)
Basophils Relative: 0.8 % (ref 0.0–3.0)
Eosinophils Absolute: 0.7 10*3/uL (ref 0.0–0.7)
Eosinophils Relative: 7.7 % — ABNORMAL HIGH (ref 0.0–5.0)
HCT: 31.3 % — ABNORMAL LOW (ref 39.0–52.0)
Hemoglobin: 10.1 g/dL — ABNORMAL LOW (ref 13.0–17.0)
Lymphocytes Relative: 15.3 % (ref 12.0–46.0)
Lymphs Abs: 1.5 10*3/uL (ref 0.7–4.0)
MCHC: 32.4 g/dL (ref 30.0–36.0)
MCV: 88.8 fl (ref 78.0–100.0)
Monocytes Absolute: 1 10*3/uL (ref 0.1–1.0)
Monocytes Relative: 10.5 % (ref 3.0–12.0)
Neutro Abs: 6.2 10*3/uL (ref 1.4–7.7)
Neutrophils Relative %: 65.7 % (ref 43.0–77.0)
Platelets: 502 10*3/uL — ABNORMAL HIGH (ref 150.0–400.0)
RBC: 3.52 Mil/uL — ABNORMAL LOW (ref 4.22–5.81)
RDW: 17.5 % — ABNORMAL HIGH (ref 11.5–14.6)
WBC: 9.5 10*3/uL (ref 4.5–10.5)

## 2011-12-18 LAB — BASIC METABOLIC PANEL
BUN: 35 mg/dL — ABNORMAL HIGH (ref 6–23)
CO2: 26 mEq/L (ref 19–32)
Calcium: 8.7 mg/dL (ref 8.4–10.5)
Chloride: 104 mEq/L (ref 96–112)
Creatinine, Ser: 1.7 mg/dL — ABNORMAL HIGH (ref 0.4–1.5)
GFR: 42.51 mL/min — ABNORMAL LOW (ref >60.00)
Glucose, Bld: 92 mg/dL (ref 70–99)
Potassium: 4.4 mEq/L (ref 3.5–5.1)
Sodium: 138 mEq/L (ref 135–145)

## 2011-12-18 MED ORDER — AMIODARONE HCL 200 MG PO TABS: 200.0000 mg | ORAL_TABLET | Freq: Every day | ORAL | Status: DC

## 2011-12-18 NOTE — Telephone Encounter (Signed)
Message copied by BECKER, AMANDA D on Fri Dec 18, 2011  1:53 PM ------      Message from: GERHARDT, LORI C      Created: Fri Dec 18, 2011 12:31 PM       Ok to report. Labs are satisfactory but with some mild kidney dysfunction. He has finished his lasix and potassium. No NSAID use. Recheck BMET in 10 days (maybe on the day he sees the surgeon?) 

## 2011-12-18 NOTE — Telephone Encounter (Signed)
Discussed cxr and lab results with patient.  He will not need to repeat cxr at [facility] tright's office.  Will return to our office 12/30/2011 for labs.  Letter mailed.  Vista Mink, CMA

## 2011-12-18 NOTE — Patient Instructions (Addendum)
Cut the amiodarone back to just one a day  We are checking labs today  Go get your chest xray today at [facility] at the [facility] today  Dr. Riley Kill will see you in 3 weeks.  Call the [facility] office at [phone] if you have any questions, problems or concerns.

## 2011-12-18 NOTE — Progress Notes (Signed)
[name] [name] Date of Birth: [birth year] Medical Record #[id number]  History of Present Illness: [name] [name] is seen today for a post hospital visit. He is seen for Dr. Riley Kill. He has a history of a recent NSTEMI with subsequent CABG x 3 per Dr. Maren Beach. His other problems include post op anemia and HLD. An echo showed LV dysfunction with an EF of 30 to 35%. He did have some post op atrial fib and is on amiodarone.   He comes in today. He is here with his daughter. He is doing well. He is anxious to go back to work. No real problems reported. Not really interested in going to cardiac rehab. Not using much pain medicine. Rhythm has been ok. Some shortness of breath. Has finished his Lasix. No swelling. No cough. Not dizzy or lightheaded. Wants to get out in the sun. Little constipated from his iron.   Current Outpatient Prescriptions on File Prior to Visit  Medication Sig Dispense Refill  . atorvastatin (LIPITOR) 20 MG tablet Take 1 tablet (20 mg total) by mouth daily at 6 PM.  30 tablet  1  . calcium-vitamin D (OSCAL WITH D) 500-200 MG-UNIT per tablet Take 1 tablet by mouth daily.      Marland Kitchen docusate sodium (COLACE) 100 MG capsule Take 100 mg by mouth daily as needed. For stool softner      . fluticasone (FLOVENT HFA) 44 MCG/ACT inhaler Inhale 1 puff into the lungs 2 (two) times daily.  1 Inhaler  1  . iron polysaccharides (NIFEREX) 150 MG capsule Take 1 capsule (150 mg total) by mouth daily.  30 capsule  1  . metoprolol tartrate (LOPRESSOR) 25 MG tablet Take 0.5 tablets (12.5 mg total) by mouth 2 (two) times daily.  30 tablet  1  . Multiple Vitamin (MULTIVITAMIN WITH MINERALS) TABS Take 1 tablet by mouth daily.      Marland Kitchen DISCONTD: amiodarone (PACERONE) 200 MG tablet Take 1 tablet (200 mg total) by mouth 2 (two) times daily.  60 tablet  1  . furosemide (LASIX) 40 MG tablet Take 1 tablet (40 mg total) by mouth daily. For 7 days  7 tablet  0  . potassium chloride (K-DUR) 10 MEQ tablet Take 1 tablet (10  mEq total) by mouth 2 (two) times daily. For 7 days  14 tablet  0    Allergies  Allergen Reactions  . Morphine And Related Shortness Of Breath  . Penicillins Shortness Of Breath    Past Medical History  Diagnosis Date  . Cancer     prostate  . GERD (gastroesophageal reflux disease)   . NSTEMI (non-ST elevated myocardial infarction) August 2013    CABG x 3 with LIMA to LAD, SVG to LCX and SVG to RCA  . LV dysfunction August 2013    EF only 30 to 35% per echo at time of MI    Past Surgical History  Procedure Date  . Hernia repair 1970's  . Coronary artery bypass graft 11/29/2011    Procedure: CORONARY ARTERY BYPASS GRAFTING (CABG);  Surgeon: Kerin Perna, MD;  Location: [facility] OR;  Service: Open Heart Surgery;  Laterality: N/A;  Coronary artery bypass graft time three using left internal mammary artery and right leg saphenous vein harvested endoscopically. Transesophageal echocardiogram performed intraoperatively.    History  Smoking status  . Former Smoker -- 2.0 packs/day for 40 years  . Types: Cigars, Cigarettes  Smokeless tobacco  . Not on file    History  Alcohol Use  . 0.6 oz/week  . 1 Shots of liquor per week    History reviewed. No pertinent family history.  Review of Systems: The review of systems is per the HPI.  All other systems were reviewed and are negative.  Physical Exam: BP 118/70  Pulse 68  Ht 5\' 9"  (1.753 m)  Wt 175 lb (79.379 kg)  BMI 25.84 kg/m2 Patient is very pleasant and in no acute distress. Skin is warm and dry. Color is a little pale.  HEENT is unremarkable. Normocephalic/atraumatic. PERRL. Sclera are nonicteric. Neck is supple. No masses. No JVD. Lungs are coarse with bibasilar rales. Cardiac exam shows a regular rate and rhythm.Sternum is ok.  Abdomen is soft. Extremities are without edema. Gait and ROM are intact. No gross neurologic deficits noted.  LABORATORY DATA: PENDING FOR TODAY  EKG today shows sinus rhythm with anterolateral T  wave changes. QT is prolonged at 486 with a QTc of 516  Assessment / Plan:  1. NSTEMI with subsequent CABG x 3. Doing ok. We will recheck some labs today. Tried to encourage him to think about rehab.  2. Post op atrial fib - in sinus. I have cut his amiodarone back to just one a day. Would hope to discontinue on return visit. Needs repeat EKG on return.   2. LV dysfunction - has finished his Lasix. Noted to have rales on exam with pretty coarse breath sounds. Will go ahead and check his CXR today. May need additional diuresis.   3. Post op anemia. Recheck CBC today.   Will check follow up labs today. Will see him back in about 3 weeks.   Patient is agreeable to this plan and will call if any problems develop in the interim.

## 2011-12-18 NOTE — Telephone Encounter (Signed)
Message copied by Awilda Bill on Fri Dec 18, 2011  1:53 PM ------      Message from: Rosalio Macadamia      Created: Fri Dec 18, 2011 12:31 PM       Ok to report. Labs are satisfactory but with some mild kidney dysfunction. He has finished his lasix and potassium. No NSAID use. Recheck BMET in 10 days (maybe on the day he sees the surgeon?)

## 2011-12-22 DIAGNOSIS — Z8546 Personal history of malignant neoplasm of prostate: Secondary | ICD-10-CM | POA: Diagnosis not present

## 2011-12-22 DIAGNOSIS — I214 Non-ST elevation (NSTEMI) myocardial infarction: Secondary | ICD-10-CM | POA: Diagnosis not present

## 2011-12-22 DIAGNOSIS — Z48812 Encounter for surgical aftercare following surgery on the circulatory system: Secondary | ICD-10-CM | POA: Diagnosis not present

## 2011-12-22 DIAGNOSIS — N189 Chronic kidney disease, unspecified: Secondary | ICD-10-CM | POA: Diagnosis not present

## 2011-12-22 DIAGNOSIS — I2510 Atherosclerotic heart disease of native coronary artery without angina pectoris: Secondary | ICD-10-CM | POA: Diagnosis not present

## 2011-12-24 DIAGNOSIS — N189 Chronic kidney disease, unspecified: Secondary | ICD-10-CM | POA: Diagnosis not present

## 2011-12-24 DIAGNOSIS — Z48812 Encounter for surgical aftercare following surgery on the circulatory system: Secondary | ICD-10-CM | POA: Diagnosis not present

## 2011-12-24 DIAGNOSIS — I214 Non-ST elevation (NSTEMI) myocardial infarction: Secondary | ICD-10-CM | POA: Diagnosis not present

## 2011-12-24 DIAGNOSIS — I2510 Atherosclerotic heart disease of native coronary artery without angina pectoris: Secondary | ICD-10-CM | POA: Diagnosis not present

## 2011-12-24 DIAGNOSIS — Z8546 Personal history of malignant neoplasm of prostate: Secondary | ICD-10-CM | POA: Diagnosis not present

## 2011-12-29 ENCOUNTER — Other Ambulatory Visit: Payer: Self-pay | Admitting: Cardiothoracic Surgery

## 2011-12-29 DIAGNOSIS — Z951 Presence of aortocoronary bypass graft: Secondary | ICD-10-CM

## 2011-12-30 ENCOUNTER — Ambulatory Visit: Payer: Medicare Other | Admitting: Cardiothoracic Surgery

## 2011-12-30 ENCOUNTER — Ambulatory Visit (INDEPENDENT_AMBULATORY_CARE_PROVIDER_SITE_OTHER): Payer: Medicare Other | Admitting: *Deleted

## 2011-12-30 DIAGNOSIS — I2510 Atherosclerotic heart disease of native coronary artery without angina pectoris: Secondary | ICD-10-CM

## 2011-12-30 LAB — BASIC METABOLIC PANEL
BUN: 36 mg/dL — ABNORMAL HIGH (ref 6–23)
Chloride: 102 mEq/L (ref 96–112)
Creatinine, Ser: 1.6 mg/dL — ABNORMAL HIGH (ref 0.4–1.5)
Glucose, Bld: 101 mg/dL — ABNORMAL HIGH (ref 70–99)
Potassium: 4.5 mEq/L (ref 3.5–5.1)

## 2012-01-01 ENCOUNTER — Ambulatory Visit (INDEPENDENT_AMBULATORY_CARE_PROVIDER_SITE_OTHER): Payer: Self-pay | Admitting: Cardiothoracic Surgery

## 2012-01-01 ENCOUNTER — Encounter: Payer: Self-pay | Admitting: Cardiothoracic Surgery

## 2012-01-01 VITALS — BP 122/75 | HR 79 | Resp 16 | Ht 69.0 in | Wt 167.2 lb

## 2012-01-01 DIAGNOSIS — Z951 Presence of aortocoronary bypass graft: Secondary | ICD-10-CM

## 2012-01-01 DIAGNOSIS — I2510 Atherosclerotic heart disease of native coronary artery without angina pectoris: Secondary | ICD-10-CM

## 2012-01-01 NOTE — Patient Instructions (Signed)
You may not lift more than 20 pounds until November 1 If you did not wish tto begin phase II cardiac rehabilitation at [facility] than you should have a 30 minute walking session 5 days every week

## 2012-01-01 NOTE — Progress Notes (Signed)
PCP is VYAS,DHRUV B., MD Referring Provider is Herby Abraham, MD  Chief Complaint  Patient presents with  . Routine Post Op    3 wk s/p CABG 11/30/11 per P.A.    HPI: 78 year old gentleman returns for routine followup after urgent multivessel bypass grafting August 4. He did well but had postoperative atrial fibrillation which converted to sinus rhythm with oral amiodarone. He is continued to progress at home and is regular independently. His had no recurrent angina and the surgical incisions are all well-healed. There is been no symptoms of CHF. His appetite and overall strength are improving. Yesterday he walked 30 minutes. He is going back to work 2 hours a day at his [employer], water [employer].   Past Medical History  Diagnosis Date  . Cancer     prostate  . GERD (gastroesophageal reflux disease)   . NSTEMI (non-ST elevated myocardial infarction) August 2013    CABG x 3 with LIMA to LAD, SVG to LCX and SVG to RCA  . LV dysfunction August 2013    EF only 30 to 35% per echo at time of MI    Past Surgical History  Procedure Date  . Hernia repair 1970's  . Coronary artery bypass graft 11/29/2011    Procedure: CORONARY ARTERY BYPASS GRAFTING (CABG);  Surgeon: Kerin Perna, MD;  Location: [facility] OR;  Service: Open Heart Surgery;  Laterality: N/A;  Coronary artery bypass graft time three using left internal mammary artery and right leg saphenous vein harvested endoscopically. Transesophageal echocardiogram performed intraoperatively.    No family history on file.  Social History History  Substance Use Topics  . Smoking status: Former Smoker -- 2.0 packs/day for 40 years    Types: Cigars, Cigarettes  . Smokeless tobacco: Not on file  . Alcohol Use: 0.6 oz/week    1 Shots of liquor per week    Current Outpatient Prescriptions  Medication Sig Dispense Refill  . amiodarone (PACERONE) 200 MG tablet Take 1 tablet (200 mg total) by mouth daily.  60 tablet  1  . aspirin 81 MG tablet Take 81 mg by  mouth daily.      Marland Kitchen atorvastatin (LIPITOR) 20 MG tablet Take 1 tablet (20 mg total) by mouth daily at 6 PM.  30 tablet  1  . calcium-vitamin D (OSCAL WITH D) 500-200 MG-UNIT per tablet Take 1 tablet by mouth daily.      Marland Kitchen docusate sodium (COLACE) 100 MG capsule Take 100 mg by mouth daily as needed. For stool softner      . fluticasone (FLOVENT HFA) 44 MCG/ACT inhaler Inhale 1 puff into the lungs 2 (two) times daily.  1 Inhaler  1  . iron polysaccharides (NIFEREX) 150 MG capsule Take 1 capsule (150 mg total) by mouth daily.  30 capsule  1  . metoprolol tartrate (LOPRESSOR) 25 MG tablet Take 0.5 tablets (12.5 mg total) by mouth 2 (two) times daily.  30 tablet  1  . Multiple Vitamin (MULTIVITAMIN WITH MINERALS) TABS Take 1 tablet by mouth daily.        Allergies  Allergen Reactions  . Morphine And Related Shortness Of Breath  . Penicillins Shortness Of Breath    Review of Systems feeling stronger no fever improved energy gaining weight back BP 122/75  Pulse 79  Resp 16  Ht 5\' 9"  (1.753 m)  Wt 167 lb 3.2 oz (75.841 kg)  BMI 24.69 kg/m2  SpO2 94% Physical Exam General alert and comfortable Lungs clear Sternum well-healed Cardiac  rhythm regular murmur Extremities without edema  Diagnostic Tests: Last chest x-ray 2 weeks ago no pleural effusion mild chronic scarring-COPD  Impression: Doing well 6 weeks post CABG Patient can resume driving in normal daily activities. Avoid lifting more than 15-20 pounds until November 1. Continue aspirin, Lipitor, beta blocker indefinitely.   Plan:Return as needed

## 2012-01-08 ENCOUNTER — Ambulatory Visit (INDEPENDENT_AMBULATORY_CARE_PROVIDER_SITE_OTHER): Payer: Medicare Other | Admitting: Cardiology

## 2012-01-08 ENCOUNTER — Encounter: Payer: Self-pay | Admitting: Cardiology

## 2012-01-08 VITALS — BP 123/77 | HR 63 | Ht 69.0 in | Wt 174.0 lb

## 2012-01-08 DIAGNOSIS — D649 Anemia, unspecified: Secondary | ICD-10-CM | POA: Diagnosis not present

## 2012-01-08 DIAGNOSIS — I2510 Atherosclerotic heart disease of native coronary artery without angina pectoris: Secondary | ICD-10-CM

## 2012-01-08 LAB — BASIC METABOLIC PANEL
BUN: 36 mg/dL — ABNORMAL HIGH (ref 6–23)
CO2: 28 mEq/L (ref 19–32)
Calcium: 9.5 mg/dL (ref 8.4–10.5)
Creatinine, Ser: 1.5 mg/dL (ref 0.4–1.5)
GFR: 47.44 mL/min — ABNORMAL LOW (ref >60.00)
Glucose, Bld: 92 mg/dL (ref 70–99)
Sodium: 137 mEq/L (ref 135–145)

## 2012-01-08 LAB — CBC WITH DIFFERENTIAL/PLATELET
Basophils Absolute: 0 10*3/uL (ref 0.0–0.1)
Eosinophils Absolute: 0.6 10*3/uL (ref 0.0–0.7)
Lymphocytes Relative: 20.5 % (ref 12.0–46.0)
MCHC: 32.7 g/dL (ref 30.0–36.0)
Monocytes Relative: 9.8 % (ref 3.0–12.0)
Neutro Abs: 5.4 10*3/uL (ref 1.4–7.7)
Neutrophils Relative %: 62.6 % (ref 43.0–77.0)
Platelets: 345 10*3/uL (ref 150.0–400.0)
RDW: 15.2 % — ABNORMAL HIGH (ref 11.5–14.6)

## 2012-01-08 NOTE — Progress Notes (Signed)
HPI:  Patient seen in followup. He really is recovering from the surgery quite nicely. He is seeing Dr. Donata Clay, and he has released him from care. The patient presented with a non-ST elevation myocardial infarction, and had a complicated proximal ostial left anterior descending stenosis along with some other disease. He underwent successful revascularization surgery by PVT, and now is doing extremely well. His family does have some concerns that he may be a little bit too active. Otherwise, there is no major problems at present time. We are repeating his laboratory studies today. Also told him he can stop his amiodarone in another few days.  Current Outpatient Prescriptions  Medication Sig Dispense Refill  . aspirin 81 MG tablet Take 81 mg by mouth daily.      Marland Kitchen atorvastatin (LIPITOR) 20 MG tablet Take 1 tablet (20 mg total) by mouth daily at 6 PM.  30 tablet  1  . calcium-vitamin D (OSCAL WITH D) 500-200 MG-UNIT per tablet Take 1 tablet by mouth daily.      Marland Kitchen docusate sodium (COLACE) 100 MG capsule Take 100 mg by mouth daily as needed. For stool softner      . fluticasone (FLOVENT HFA) 44 MCG/ACT inhaler Inhale 1 puff into the lungs 2 (two) times daily.  1 Inhaler  1  . metoprolol tartrate (LOPRESSOR) 25 MG tablet Take 0.5 tablets (12.5 mg total) by mouth 2 (two) times daily.  30 tablet  1  . Multiple Vitamin (MULTIVITAMIN WITH MINERALS) TABS Take 1 tablet by mouth daily.        Allergies  Allergen Reactions  . Morphine And Related Shortness Of Breath  . Penicillins Shortness Of Breath    Past Medical History  Diagnosis Date  . Cancer     prostate  . GERD (gastroesophageal reflux disease)   . NSTEMI (non-ST elevated myocardial infarction) August 2013    CABG x 3 with LIMA to LAD, SVG to LCX and SVG to RCA  . LV dysfunction August 2013    EF only 30 to 35% per echo at time of MI    Past Surgical History  Procedure Date  . Hernia repair 1970's  . Coronary artery bypass graft  11/29/2011    Procedure: CORONARY ARTERY BYPASS GRAFTING (CABG);  Surgeon: Kerin Perna, MD;  Location: [facility] OR;  Service: Open Heart Surgery;  Laterality: N/A;  Coronary artery bypass graft time three using left internal mammary artery and right leg saphenous vein harvested endoscopically. Transesophageal echocardiogram performed intraoperatively.    No family history on file.  History   Social History  . Marital Status: Divorced    Spouse Name: N/A    Number of Children: N/A  . Years of Education: N/A   Occupational History  . Not on file.   Social History Main Topics  . Smoking status: Former Smoker -- 2.0 packs/day for 40 years    Types: Cigars, Cigarettes  . Smokeless tobacco: Not on file  . Alcohol Use: 0.6 oz/week    1 Shots of liquor per week  . Drug Use: No  . Sexually Active: No   Other Topics Concern  . Not on file   Social History Narrative  . No narrative on file    ROS: Please see the HPI.  All other systems reviewed and negative.  PHYSICAL EXAM:  BP 123/77  Pulse 63  Ht 5\' 9"  (1.753 m)  Wt 174 lb (78.926 kg)  BMI 25.70 kg/m2  General: Well developed, well  nourished, in no acute distress. Head:  Normocephalic and atraumatic. Neck: no JVD Lungs: Clear to auscultation and percussion. Heart: Normal S1 and S2.  No murmur, rubs or gallops.  Pulses: Pulses normal in all 4 extremities. Extremities: No clubbing or cyanosis. No edema. Neurologic: Alert and oriented x 3.  EKG:  NSR.Marland KitchenSlightly prolonged QTc.  Leftward axis.  T inversion in 1,AVL, V2.  TWI inversion in V3-V5 is improved for prior tracing.   ASSESSMENT AND PLAN:

## 2012-01-08 NOTE — Assessment & Plan Note (Signed)
Recheck CBC. 

## 2012-01-08 NOTE — Patient Instructions (Signed)
Your physician has recommended you make the following change in your medication: STOP Amiodarone  Your physician recommends that you have lab work today: LIVER, BMP, CBC  Your physician recommends that you schedule a follow-up appointment in: 4 WEEKS

## 2012-01-08 NOTE — Assessment & Plan Note (Signed)
Stable post CABG.  Continue close follow up in office.

## 2012-01-08 NOTE — Assessment & Plan Note (Signed)
Recheck labs and encourage PO fluids.

## 2012-01-12 ENCOUNTER — Other Ambulatory Visit: Payer: Self-pay

## 2012-01-12 DIAGNOSIS — I2510 Atherosclerotic heart disease of native coronary artery without angina pectoris: Secondary | ICD-10-CM

## 2012-01-21 ENCOUNTER — Other Ambulatory Visit: Payer: Medicare Other

## 2012-02-01 ENCOUNTER — Other Ambulatory Visit (INDEPENDENT_AMBULATORY_CARE_PROVIDER_SITE_OTHER): Payer: Medicare Other

## 2012-02-01 DIAGNOSIS — I2510 Atherosclerotic heart disease of native coronary artery without angina pectoris: Secondary | ICD-10-CM | POA: Diagnosis not present

## 2012-02-01 LAB — BASIC METABOLIC PANEL
CO2: 29 mEq/L (ref 19–32)
Chloride: 102 mEq/L (ref 96–112)
Creatinine, Ser: 1.6 mg/dL — ABNORMAL HIGH (ref 0.4–1.5)
Potassium: 5.9 mEq/L — ABNORMAL HIGH (ref 3.5–5.1)
Sodium: 138 mEq/L (ref 135–145)

## 2012-02-01 LAB — HEPATIC FUNCTION PANEL
ALT: 17 U/L (ref 0–53)
Alkaline Phosphatase: 46 U/L (ref 39–117)
Bilirubin, Direct: 0.1 mg/dL (ref 0.0–0.3)
Total Bilirubin: 0.6 mg/dL (ref 0.3–1.2)

## 2012-02-02 ENCOUNTER — Other Ambulatory Visit (INDEPENDENT_AMBULATORY_CARE_PROVIDER_SITE_OTHER): Payer: Medicare Other

## 2012-02-02 ENCOUNTER — Other Ambulatory Visit: Payer: Self-pay | Admitting: *Deleted

## 2012-02-02 ENCOUNTER — Other Ambulatory Visit: Payer: Medicare Other

## 2012-02-02 DIAGNOSIS — I2510 Atherosclerotic heart disease of native coronary artery without angina pectoris: Secondary | ICD-10-CM | POA: Diagnosis not present

## 2012-02-02 LAB — BASIC METABOLIC PANEL
BUN: 32 mg/dL — ABNORMAL HIGH (ref 6–23)
Chloride: 103 mEq/L (ref 96–112)
Creatinine, Ser: 1.5 mg/dL (ref 0.4–1.5)
GFR: 47.44 mL/min — ABNORMAL LOW (ref >60.00)
Glucose, Bld: 87 mg/dL (ref 70–99)
Potassium: 5.2 mEq/L — ABNORMAL HIGH (ref 3.5–5.1)

## 2012-02-03 ENCOUNTER — Ambulatory Visit (INDEPENDENT_AMBULATORY_CARE_PROVIDER_SITE_OTHER): Payer: Medicare Other | Admitting: Cardiology

## 2012-02-03 ENCOUNTER — Encounter: Payer: Self-pay | Admitting: Cardiology

## 2012-02-03 VITALS — BP 138/76 | HR 74 | Resp 18 | Ht 69.0 in | Wt 177.8 lb

## 2012-02-03 DIAGNOSIS — I2510 Atherosclerotic heart disease of native coronary artery without angina pectoris: Secondary | ICD-10-CM | POA: Diagnosis not present

## 2012-02-03 DIAGNOSIS — E785 Hyperlipidemia, unspecified: Secondary | ICD-10-CM | POA: Diagnosis not present

## 2012-02-03 NOTE — Assessment & Plan Note (Signed)
I reviewed the patient's numbers with him in detail. I encouraged him to stay well-hydrated. We also reviewed his CBC, and review his renal function numbers. We'll continue to monitor this on a regular basis. No other interventions are planned at this time. Patient is not on an ACE inhibitor. We will get a 2-D echo to assess his left ventricular function next office visit.

## 2012-02-03 NOTE — Assessment & Plan Note (Signed)
We will assess his lipid profile with his next blood draw.

## 2012-02-03 NOTE — Patient Instructions (Addendum)
Your physician has requested that you have an echocardiogram in 2 MONTHS. Echocardiography is a painless test that uses sound waves to create images of your heart. It provides your doctor with information about the size and shape of your heart and how well your heart's chambers and valves are working. This procedure takes approximately one hour. There are no restrictions for this procedure.  Your physician has requested that you have an exercise tolerance test in 2 MONTHS. For further information please visit [email or web address] Please also follow instruction sheet, as given.  Your physician recommends that you return for lab work in: 10 days for a BMP and LIPID --please have this done at the [facility] in [place] while FASTING (order given to the patient)  Your physician recommends that you continue on your current medications as directed. Please refer to the Current Medication list given to you today.  Foods Rich in Potassium Food / Potassium (mg)  Apricots, dried,  cup / 378 mg   Apricots, raw, 1 cup halves / 401 mg   Avocado,  / 487 mg   Banana, 1 large / 487 mg   Beef, lean, round, 3 oz / 202 mg   Cantaloupe, 1 cup cubes / 427 mg   Dates, medjool, 5 whole / 835 mg   Ham, cured, 3 oz / 212 mg   Lentils, dried,  cup / 458 mg   Lima beans, frozen,  cup / 258 mg   Orange, 1 large / 333 mg   Orange juice, 1 cup / 443 mg   Peaches, dried,  cup / 398 mg   Peas, split, cooked,  cup / 355 mg   Potato, boiled, 1 medium / 515 mg   Prunes, dried, uncooked,  cup / 318 mg   Raisins,  cup / 309 mg   Salmon, pink, raw, 3 oz / 275 mg   Sardines, canned , 3 oz / 338 mg   Tomato, raw, 1 medium / 292 mg   Tomato juice, 6 oz / 417 mg   [place], 3 oz / 349 mg  Document Released: 04/13/2005 Document Revised: 12/24/2010 Document Reviewed: 08/27/2008 [facility] Patient Information 2012 [place], [place].

## 2012-02-03 NOTE — Assessment & Plan Note (Signed)
Patient is recovered nicely from his surgery. He would continue to remain active.

## 2012-02-03 NOTE — Progress Notes (Signed)
HPI:  The patient is in for followup. He is doing extremely well. He is recovered nicely from his surgery. He would like to do some dancing at the upcoming masquerad ball.  His been pretty active, and not had any significant chest pain. He did have an elevated potassium, but on repeat was 5.2. Otherwise, he is getting along extremely well.  Current Outpatient Prescriptions  Medication Sig Dispense Refill  . aspirin 81 MG tablet Take 81 mg by mouth daily.      Marland Kitchen atorvastatin (LIPITOR) 20 MG tablet Take 1 tablet (20 mg total) by mouth daily at 6 PM.  30 tablet  1  . calcium-vitamin D (OSCAL WITH D) 500-200 MG-UNIT per tablet Take 1 tablet by mouth daily.      Marland Kitchen docusate sodium (COLACE) 100 MG capsule Take 100 mg by mouth daily as needed. For stool softner      . fluticasone (FLOVENT HFA) 44 MCG/ACT inhaler Inhale 1 puff into the lungs 2 (two) times daily.  1 Inhaler  1  . metoprolol tartrate (LOPRESSOR) 25 MG tablet Take 0.5 tablets (12.5 mg total) by mouth 2 (two) times daily.  30 tablet  1  . Multiple Vitamin (MULTIVITAMIN WITH MINERALS) TABS Take 1 tablet by mouth daily.        Allergies  Allergen Reactions  . Morphine And Related Shortness Of Breath  . Penicillins Shortness Of Breath    Past Medical History  Diagnosis Date  . Cancer     prostate  . GERD (gastroesophageal reflux disease)   . NSTEMI (non-ST elevated myocardial infarction) August 2013    CABG x 3 with LIMA to LAD, SVG to LCX and SVG to RCA  . LV dysfunction August 2013    EF only 30 to 35% per echo at time of MI    Past Surgical History  Procedure Date  . Hernia repair 1970's  . Coronary artery bypass graft 11/29/2011    Procedure: CORONARY ARTERY BYPASS GRAFTING (CABG);  Surgeon: Kerin Perna, MD;  Location: [facility] OR;  Service: Open Heart Surgery;  Laterality: N/A;  Coronary artery bypass graft time three using left internal mammary artery and right leg saphenous vein harvested endoscopically. Transesophageal  echocardiogram performed intraoperatively.    No family history on file.  History   Social History  . Marital Status: Divorced    Spouse Name: N/A    Number of Children: N/A  . Years of Education: N/A   Occupational History  . Not on file.   Social History Main Topics  . Smoking status: Former Smoker -- 2.0 packs/day for 40 years    Types: Cigars, Cigarettes  . Smokeless tobacco: Not on file  . Alcohol Use: 0.6 oz/week    1 Shots of liquor per week  . Drug Use: No  . Sexually Active: No   Other Topics Concern  . Not on file   Social History Narrative  . No narrative on file    ROS: Please see the HPI.  All other systems reviewed and negative.  PHYSICAL EXAM:  BP 138/76  Pulse 74  Resp 18  Ht 5\' 9"  (1.753 m)  Wt 177 lb 12.8 oz (80.65 kg)  BMI 26.26 kg/m2  SpO2 89%  General: Well developed, well nourished, in no acute distress. Head:  Normocephalic and atraumatic. Neck: no JVD Lungs: Clear to auscultation and percussion. Heart: Normal S1 and S2.  No murmur, rubs or gallops.  Pulses: Pulses normal in all 4 extremities.  Extremities: No clubbing or cyanosis. No edema. Neurologic: Alert and oriented x 3.  EKG:  ASSESSMENT AND PLAN:

## 2012-02-15 ENCOUNTER — Encounter: Payer: Self-pay | Admitting: Cardiology

## 2012-02-15 DIAGNOSIS — I2510 Atherosclerotic heart disease of native coronary artery without angina pectoris: Secondary | ICD-10-CM | POA: Diagnosis not present

## 2012-02-15 DIAGNOSIS — Z23 Encounter for immunization: Secondary | ICD-10-CM | POA: Diagnosis not present

## 2012-02-15 DIAGNOSIS — N183 Chronic kidney disease, stage 3 unspecified: Secondary | ICD-10-CM | POA: Diagnosis not present

## 2012-03-09 DIAGNOSIS — Z79899 Other long term (current) drug therapy: Secondary | ICD-10-CM | POA: Diagnosis not present

## 2012-03-09 DIAGNOSIS — C61 Malignant neoplasm of prostate: Secondary | ICD-10-CM | POA: Diagnosis not present

## 2012-03-09 DIAGNOSIS — Z1212 Encounter for screening for malignant neoplasm of rectum: Secondary | ICD-10-CM | POA: Diagnosis not present

## 2012-03-09 DIAGNOSIS — Z0000 Encounter for general adult medical examination without abnormal findings: Secondary | ICD-10-CM | POA: Diagnosis not present

## 2012-03-09 DIAGNOSIS — R5381 Other malaise: Secondary | ICD-10-CM | POA: Diagnosis not present

## 2012-03-11 DIAGNOSIS — I10 Essential (primary) hypertension: Secondary | ICD-10-CM | POA: Diagnosis not present

## 2012-04-06 ENCOUNTER — Ambulatory Visit (HOSPITAL_COMMUNITY): Payer: Medicare Other | Attending: Cardiovascular Disease

## 2012-04-06 DIAGNOSIS — I252 Old myocardial infarction: Secondary | ICD-10-CM | POA: Diagnosis not present

## 2012-04-06 DIAGNOSIS — N189 Chronic kidney disease, unspecified: Secondary | ICD-10-CM | POA: Insufficient documentation

## 2012-04-06 DIAGNOSIS — C61 Malignant neoplasm of prostate: Secondary | ICD-10-CM | POA: Insufficient documentation

## 2012-04-06 DIAGNOSIS — Z87891 Personal history of nicotine dependence: Secondary | ICD-10-CM | POA: Diagnosis not present

## 2012-04-06 DIAGNOSIS — I2510 Atherosclerotic heart disease of native coronary artery without angina pectoris: Secondary | ICD-10-CM | POA: Diagnosis not present

## 2012-04-06 DIAGNOSIS — I517 Cardiomegaly: Secondary | ICD-10-CM | POA: Diagnosis not present

## 2012-04-06 DIAGNOSIS — E785 Hyperlipidemia, unspecified: Secondary | ICD-10-CM | POA: Insufficient documentation

## 2012-04-06 NOTE — Progress Notes (Signed)
Echocardiogram performed.  

## 2012-04-12 ENCOUNTER — Ambulatory Visit (INDEPENDENT_AMBULATORY_CARE_PROVIDER_SITE_OTHER): Payer: Medicare Other | Admitting: Cardiology

## 2012-04-12 DIAGNOSIS — I2510 Atherosclerotic heart disease of native coronary artery without angina pectoris: Secondary | ICD-10-CM

## 2012-04-12 NOTE — Progress Notes (Signed)
Exercise Treadmill Test  Pre-Exercise Testing Evaluation Rhythm: normal sinus  Rate: 83                 Test  Exercise Tolerance Test Ordering MD: Shawnie Pons, MD  Interpreting MD: Shawnie Pons, MD  Unique Test No: 1  Treadmill:  1  Indication for ETT: CAD  Contraindication to ETT: No   Stress Modality: exercise - treadmill  Cardiac Imaging Performed: non   Protocol: standard Bruce - maximal  Max BP:  173/70/  Max MPHR (bpm):  137 85% MPR (bpm):  116  MPHR obtained (bpm):  129 % MPHR obtained:  94  Reached 85% MPHR (min:sec):  1:01 Total Exercise Time (min-sec):  1:48  Workload in METS:  3.8 Borg Scale: 17  Reason ETT Terminated:  fatigue    ST Segment Analysis At Rest: normal ST segments - no evidence of significant ST depression With Exercise: no evidence of significant ST depression  Other Information Arrhythmia:  No Angina during ETT:  absent (0) Quality of ETT:  diagnostic  ETT Interpretation:  normal - no evidence of ischemia by ST analysis  Comments: Patient exercised for only about a minute.  He had moderate exercise intolerance.  He did not have chest pain or ST changes.  Other than deconditioned response, negative for ischemia.  He had rare PVC  Recommendations:  Continue exercise.  Echo today reviewed and shows normal LV function.  Continue medical management.

## 2012-04-14 DIAGNOSIS — C61 Malignant neoplasm of prostate: Secondary | ICD-10-CM | POA: Diagnosis not present

## 2012-04-15 ENCOUNTER — Encounter: Payer: Medicare Other | Admitting: Internal Medicine

## 2012-04-15 DIAGNOSIS — M949 Disorder of cartilage, unspecified: Secondary | ICD-10-CM

## 2012-04-15 DIAGNOSIS — M899 Disorder of bone, unspecified: Secondary | ICD-10-CM | POA: Diagnosis not present

## 2012-04-15 DIAGNOSIS — I2510 Atherosclerotic heart disease of native coronary artery without angina pectoris: Secondary | ICD-10-CM

## 2012-04-15 DIAGNOSIS — C61 Malignant neoplasm of prostate: Secondary | ICD-10-CM | POA: Diagnosis not present

## 2012-05-24 DIAGNOSIS — H35049 Retinal micro-aneurysms, unspecified, unspecified eye: Secondary | ICD-10-CM | POA: Diagnosis not present

## 2012-05-24 DIAGNOSIS — H4720 Unspecified optic atrophy: Secondary | ICD-10-CM | POA: Diagnosis not present

## 2012-05-24 DIAGNOSIS — H348392 Tributary (branch) retinal vein occlusion, unspecified eye, stable: Secondary | ICD-10-CM | POA: Diagnosis not present

## 2012-05-24 DIAGNOSIS — H35359 Cystoid macular degeneration, unspecified eye: Secondary | ICD-10-CM | POA: Diagnosis not present

## 2012-06-07 DIAGNOSIS — H348392 Tributary (branch) retinal vein occlusion, unspecified eye, stable: Secondary | ICD-10-CM | POA: Diagnosis not present

## 2012-06-07 DIAGNOSIS — H35359 Cystoid macular degeneration, unspecified eye: Secondary | ICD-10-CM | POA: Diagnosis not present

## 2012-06-13 ENCOUNTER — Ambulatory Visit (INDEPENDENT_AMBULATORY_CARE_PROVIDER_SITE_OTHER): Payer: Medicare Other | Admitting: Cardiology

## 2012-06-13 ENCOUNTER — Encounter: Payer: Self-pay | Admitting: Cardiology

## 2012-06-13 VITALS — BP 132/88 | HR 80 | Ht 69.0 in | Wt 185.0 lb

## 2012-06-13 DIAGNOSIS — E875 Hyperkalemia: Secondary | ICD-10-CM | POA: Insufficient documentation

## 2012-06-13 DIAGNOSIS — I10 Essential (primary) hypertension: Secondary | ICD-10-CM | POA: Diagnosis not present

## 2012-06-13 DIAGNOSIS — E785 Hyperlipidemia, unspecified: Secondary | ICD-10-CM

## 2012-06-13 DIAGNOSIS — I2510 Atherosclerotic heart disease of native coronary artery without angina pectoris: Secondary | ICD-10-CM | POA: Diagnosis not present

## 2012-06-13 DIAGNOSIS — D649 Anemia, unspecified: Secondary | ICD-10-CM

## 2012-06-13 NOTE — Assessment & Plan Note (Signed)
Patient is doing well.  No cardiac complaints.  Does not want to take beta blockers.  Discussed in some detail.

## 2012-06-13 NOTE — Patient Instructions (Addendum)
Your physician has requested that you regularly monitor and record your blood pressure readings at home. Please use the same machine at the same time of day to check your readings and record them to bring to your follow-up visit. Please check your BP 1-2 times per week and take readings into follow-up appointment with Dr Sherril Croon. Please arrange follow-up with Dr Sherril Croon in 1 month for a BP check and potassium level.   Your physician wants you to follow-up in: 6 MONTHS with Dr Diona Browner in the [place] office.  You will receive a reminder letter in the mail two months in advance. If you don't receive a letter, please call our office to schedule the follow-up appointment.  Your physician recommends that you continue on your current medications as directed. Please refer to the Current Medication list given to you today.

## 2012-06-13 NOTE — Progress Notes (Signed)
   HPI:  This nice patient is in for follow up.  He is doing quite well.  No chest pain.  He stopped taking his beta blocker, and his wish is not to take medications.  He is doing well otherwise.    Current Outpatient Prescriptions  Medication Sig Dispense Refill  . aspirin 81 MG tablet Take 81 mg by mouth daily.      . calcium-vitamin D (OSCAL WITH D) 500-200 MG-UNIT per tablet Take 1 tablet by mouth daily.      Marland Kitchen docusate sodium (COLACE) 100 MG capsule Take 100 mg by mouth daily as needed. For stool softner      . Multiple Vitamin (MULTIVITAMIN WITH MINERALS) TABS Take 1 tablet by mouth daily.       No current facility-administered medications for this visit.    Allergies  Allergen Reactions  . Morphine And Related Shortness Of Breath  . Penicillins Shortness Of Breath    Past Medical History  Diagnosis Date  . Cancer     prostate  . GERD (gastroesophageal reflux disease)   . NSTEMI (non-ST elevated myocardial infarction) August 2013    CABG x 3 with LIMA to LAD, SVG to LCX and SVG to RCA  . LV dysfunction August 2013    EF only 30 to 35% per echo at time of MI    Past Surgical History  Procedure Laterality Date  . Hernia repair  1970's  . Coronary artery bypass graft  11/29/2011    Procedure: CORONARY ARTERY BYPASS GRAFTING (CABG);  Surgeon: Kerin Perna, MD;  Location: [facility] OR;  Service: Open Heart Surgery;  Laterality: N/A;  Coronary artery bypass graft time three using left internal mammary artery and right leg saphenous vein harvested endoscopically. Transesophageal echocardiogram performed intraoperatively.    No family history on file.  History   Social History  . Marital Status: Divorced    Spouse Name: N/A    Number of Children: N/A  . Years of Education: N/A   Occupational History  . Not on file.   Social History Main Topics  . Smoking status: Former Smoker -- 2.00 packs/day for 40 years    Types: Cigars, Cigarettes  . Smokeless tobacco: Not on file  .  Alcohol Use: 0.6 oz/week    1 Shots of liquor per week  . Drug Use: No  . Sexually Active: No   Other Topics Concern  . Not on file   Social History Narrative  . No narrative on file    ROS: Please see the HPI.  All other systems reviewed and negative.  PHYSICAL EXAM:  BP 132/88  Pulse 80  Ht 5\' 9"  (1.753 m)  Wt 185 lb (83.915 kg)  BMI 27.31 kg/m2  SpO2 94% Rechecked by me 150/90 and rechecked.    General: Well developed, well nourished, in no acute distress. Head:  Normocephalic and atraumatic. Neck: no JVD Lungs: minimal basilar crackles, otherwise clear.  Clear with cough.   Heart: Normal S1 and S2.  Pos S4.  Minimal SEM.  Pulses: Pulses normal in all 4 extremities. Extremities: No clubbing or cyanosis. No edema. Neurologic: Alert and oriented x 3.  EKG:  NSR.  Left axis deviation.  T wave inversion in V1 and V2.  Compared to September, this tracing is improved.   ASSESSMENT AND PLAN:

## 2012-06-13 NOTE — Assessment & Plan Note (Signed)
Noted today in clinic.  BP by me was 150/90.  No current symptoms.  Would rather not take BP meds or beta blockers.  He agreed that he would get BP cuff and measure 1-2 times per week.  I encouraged him to take to Dr. Sherril Croon for follow up.

## 2012-06-13 NOTE — Assessment & Plan Note (Signed)
Encouraged him to have check with Dr. Sherril Croon next month.

## 2012-06-13 NOTE — Assessment & Plan Note (Signed)
Not taking any statins.  Does not want to take meds.

## 2012-06-13 NOTE — Assessment & Plan Note (Signed)
Recently seen at the Cancer center in [place].

## 2012-06-17 DIAGNOSIS — H35049 Retinal micro-aneurysms, unspecified, unspecified eye: Secondary | ICD-10-CM | POA: Diagnosis not present

## 2012-06-17 DIAGNOSIS — H35359 Cystoid macular degeneration, unspecified eye: Secondary | ICD-10-CM | POA: Diagnosis not present

## 2012-08-11 DIAGNOSIS — H348392 Tributary (branch) retinal vein occlusion, unspecified eye, stable: Secondary | ICD-10-CM | POA: Diagnosis not present

## 2012-08-11 DIAGNOSIS — H35049 Retinal micro-aneurysms, unspecified, unspecified eye: Secondary | ICD-10-CM | POA: Diagnosis not present

## 2012-08-11 DIAGNOSIS — H35359 Cystoid macular degeneration, unspecified eye: Secondary | ICD-10-CM | POA: Diagnosis not present

## 2012-09-05 DIAGNOSIS — T148 Other injury of unspecified body region: Secondary | ICD-10-CM | POA: Diagnosis not present

## 2012-09-05 DIAGNOSIS — W57XXXA Bitten or stung by nonvenomous insect and other nonvenomous arthropods, initial encounter: Secondary | ICD-10-CM | POA: Diagnosis not present

## 2012-10-24 ENCOUNTER — Encounter: Payer: Medicare Other | Admitting: Internal Medicine

## 2012-10-24 ENCOUNTER — Encounter: Payer: Self-pay | Admitting: Internal Medicine

## 2012-10-24 DIAGNOSIS — I2510 Atherosclerotic heart disease of native coronary artery without angina pectoris: Secondary | ICD-10-CM | POA: Diagnosis not present

## 2012-10-24 DIAGNOSIS — C61 Malignant neoplasm of prostate: Secondary | ICD-10-CM | POA: Diagnosis not present

## 2012-11-10 DIAGNOSIS — H35359 Cystoid macular degeneration, unspecified eye: Secondary | ICD-10-CM | POA: Diagnosis not present

## 2012-11-10 DIAGNOSIS — H348392 Tributary (branch) retinal vein occlusion, unspecified eye, stable: Secondary | ICD-10-CM | POA: Diagnosis not present

## 2012-12-16 ENCOUNTER — Encounter: Payer: Self-pay | Admitting: Cardiology

## 2012-12-16 ENCOUNTER — Ambulatory Visit (INDEPENDENT_AMBULATORY_CARE_PROVIDER_SITE_OTHER): Payer: Medicare Other | Admitting: Cardiology

## 2012-12-16 VITALS — BP 134/83 | HR 91 | Ht 70.0 in | Wt 190.0 lb

## 2012-12-16 DIAGNOSIS — E785 Hyperlipidemia, unspecified: Secondary | ICD-10-CM

## 2012-12-16 DIAGNOSIS — I10 Essential (primary) hypertension: Secondary | ICD-10-CM

## 2012-12-16 DIAGNOSIS — N183 Chronic kidney disease, stage 3 unspecified: Secondary | ICD-10-CM

## 2012-12-16 DIAGNOSIS — I2510 Atherosclerotic heart disease of native coronary artery without angina pectoris: Secondary | ICD-10-CM

## 2012-12-16 NOTE — Progress Notes (Signed)
Clinical Summary [name] [name] is an 79 y.o.male presenting for office followup. He is a former patient of Dr. Riley Kill, last seen in October 2013. I reviewed the previous notes. Patient has preferred a fairly conservative approach overall, does not want to take regular medications such as beta blockers. He continues to do well, still works as an Pensions consultant in [place]. He reports no significant angina symptoms, no progressive shortness of breath or leg edema. He states that he will be following up with Dr. Sherril Croon in the next month or so for his routine physical.  Standard GXT in December of 2013 revealed no ischemic changes at low level exercise. Echocardiogram at that time showed LVEF 60-65% with basal inferior septal hypokinesis, grade 1 diastolic dysfunction, mildly dilated left atrium.  Lipid panel from October of last year showed cholesterol 135, triglycerides 81, HDL 84, and LDL 35. He has not been on any specific medical therapy for his lipids.   Allergies  Allergen Reactions  . Morphine And Related Shortness Of Breath  . Penicillins Shortness Of Breath    Current Outpatient Prescriptions  Medication Sig Dispense Refill  . aspirin 81 MG tablet Take 81 mg by mouth daily.      . calcium-vitamin D (OSCAL WITH D) 500-200 MG-UNIT per tablet Take 1 tablet by mouth daily.      Marland Kitchen docusate sodium (COLACE) 100 MG capsule Take 100 mg by mouth daily as needed. For stool softner      . Multiple Vitamin (MULTIVITAMIN WITH MINERALS) TABS Take 1 tablet by mouth daily.       No current facility-administered medications for this visit.    Past Medical History  Diagnosis Date  . Prostate cancer   . GERD (gastroesophageal reflux disease)   . NSTEMI (non-ST elevated myocardial infarction) August 2013  . 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 Coronary atherosclerosis of native coronary artery     Status post CABG - LIMA to LAD, SVG to LCX  and SVG to RCA  . CKD (chronic kidney disease) stage 3, GFR 30-59 ml/min     Past Surgical History  Procedure Laterality Date  . Hernia repair  1970's  . Coronary artery bypass graft  11/29/2011    Procedure: CORONARY ARTERY BYPASS GRAFTING (CABG);  Surgeon: Kerin Perna, MD;  Location: [facility] OR;  Service: Open Heart Surgery;  Laterality: N/A;  Coronary artery bypass graft time three using left internal mammary artery and right leg saphenous vein harvested endoscopically. Transesophageal echocardiogram performed intraoperatively.    Social History Mr. Schellinger reports that he has quit smoking. His smoking use included Cigars and Cigarettes. He has a 80 pack-year smoking history. He does not have any smokeless tobacco history on file. Mr. Kandel reports that he drinks about 0.6 ounces of alcohol per week.  Review of Systems No palpitations, dizziness, syncope. Reports stable appetite. No significant bleeding problems.  Physical Examination Filed Vitals:   12/16/12 0812  BP: 134/83  Pulse: 91   Filed Weights   12/16/12 0812  Weight: 190 lb (86.183 kg)   Patient appears comfortable at rest. HEENT: Conjunctiva and lids normal, oropharynx clear. Neck: Supple, no elevated JVP or carotid bruits, no thyromegaly. Lungs: Clear to auscultation, nonlabored breathing at rest. Cardiac: Regular rate and rhythm, no S3 or significant systolic murmur, no pericardial rub. Thorax: Stable status post sternotomy. Abdomen: Soft, nontender, bowel sounds present. Extremities: No pitting edema,  distal pulses 2+. Skin: Warm and dry. Musculoskeletal: No kyphosis. Neuropsychiatric: Alert and oriented x3, affect grossly appropriate.   Problem List and Plan   Coronary atherosclerosis of native coronary artery Symptomatically stable status post CABG last year as outlined. He continues on aspirin alone at this time. Otherwise tells me that he prefers to hold off on other medications concerned about the potential  side effects. Fortunately, he has been doing well and we will continue observation for now. Echocardiogram in December of last year showed normalization of LVEF following revascularization.  Essential hypertension Blood pressure actually looks better today. Continue to keep an eye on this with Dr. Sherril Croon.  Hyperlipidemia Reported history, although LDL was only 35 back in October of last year. He is not on any specific medications.  CKD (chronic kidney disease), stage III Keep follow up with Dr. Sherril Croon.    Jonelle Sidle, M.D., F.A.C.C.

## 2012-12-16 NOTE — Patient Instructions (Addendum)

## 2012-12-16 NOTE — Assessment & Plan Note (Signed)
Reported history, although LDL was only 35 back in October of last year. He is not on any specific medications.

## 2012-12-16 NOTE — Assessment & Plan Note (Signed)
Keep followup with Dr. Vyas. 

## 2012-12-16 NOTE — Assessment & Plan Note (Signed)
Blood pressure actually looks better today. Continue to keep an eye on this with Dr. Sherril Croon.

## 2012-12-16 NOTE — Assessment & Plan Note (Addendum)
Symptomatically stable status post CABG last year as outlined. He continues on aspirin alone at this time. Otherwise tells me that he prefers to hold off on other medications concerned about the potential side effects. Fortunately, he has been doing well and we will continue observation for now. Echocardiogram in December of last year showed normalization of LVEF following revascularization.

## 2013-02-04 DIAGNOSIS — Z23 Encounter for immunization: Secondary | ICD-10-CM | POA: Diagnosis not present

## 2013-05-08 DIAGNOSIS — H35359 Cystoid macular degeneration, unspecified eye: Secondary | ICD-10-CM | POA: Diagnosis not present

## 2013-05-08 DIAGNOSIS — H43819 Vitreous degeneration, unspecified eye: Secondary | ICD-10-CM | POA: Diagnosis not present

## 2013-05-08 DIAGNOSIS — H35049 Retinal micro-aneurysms, unspecified, unspecified eye: Secondary | ICD-10-CM | POA: Diagnosis not present

## 2013-05-08 DIAGNOSIS — H348392 Tributary (branch) retinal vein occlusion, unspecified eye, stable: Secondary | ICD-10-CM | POA: Diagnosis not present

## 2013-05-18 ENCOUNTER — Encounter: Payer: Self-pay | Admitting: Cardiology

## 2013-05-18 DIAGNOSIS — M810 Age-related osteoporosis without current pathological fracture: Secondary | ICD-10-CM | POA: Diagnosis not present

## 2013-05-18 DIAGNOSIS — C61 Malignant neoplasm of prostate: Secondary | ICD-10-CM | POA: Diagnosis not present

## 2013-06-15 ENCOUNTER — Encounter: Payer: Self-pay | Admitting: Cardiology

## 2013-06-15 ENCOUNTER — Ambulatory Visit (INDEPENDENT_AMBULATORY_CARE_PROVIDER_SITE_OTHER): Payer: Medicare Other | Admitting: Cardiology

## 2013-06-15 VITALS — BP 132/81 | HR 96 | Ht 70.0 in | Wt 191.8 lb

## 2013-06-15 DIAGNOSIS — I2510 Atherosclerotic heart disease of native coronary artery without angina pectoris: Secondary | ICD-10-CM | POA: Diagnosis not present

## 2013-06-15 DIAGNOSIS — I10 Essential (primary) hypertension: Secondary | ICD-10-CM

## 2013-06-15 DIAGNOSIS — N183 Chronic kidney disease, stage 3 unspecified: Secondary | ICD-10-CM

## 2013-06-15 NOTE — Assessment & Plan Note (Signed)
Recent creatinine 1.3.

## 2013-06-15 NOTE — Assessment & Plan Note (Signed)
Continues to prefer overall conservative approach, no specific medical therapy. Recommend sodium restriction, diet and exercise.

## 2013-06-15 NOTE — Patient Instructions (Signed)

## 2013-06-15 NOTE — Assessment & Plan Note (Signed)
Symptomatically stable on medical therapy, ECG reviewed. Continue observation. Recommend basic walking regimen. Reminded him to keep followup physical with [name]. Office visit in 6 months.

## 2013-06-15 NOTE — Progress Notes (Signed)
Clinical Summary Mr. Bloodsaw is an 80 y.o.male last seen in August 2014. He reports no new cardiac symptoms, specifically no angina or progressive shortness of breath. 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 Otherwise enjoys work around his home and taking care of his dogs. He has not been exercising, we did talk about a basic walking regimen.  Recent lab work from January showed BUN 29, creatinine 1.3, potassium 4.3, normal LFTs, hemoglobin 13.1, platelets 273. He has not seen [name] as yet for a followup physical. In general he has preferred to keep his medical regimen fairly limited, essentially is on aspirin alone from a cardiac perspective. Not on statin therapy with very low LDL at baseline (35).  Echocardiogram from December 2013 showed LVEF 60-65% with grade 1 diastolic dysfunction, mild left atrial enlargement.  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   Allergies  Allergen Reactions  . Morphine And Related Shortness Of Breath  . Penicillins Shortness Of Breath    Current Outpatient Prescriptions  Medication Sig Dispense Refill  . aspirin 81 MG tablet Take 81 mg by mouth daily.      . calcium-vitamin D (OSCAL WITH D) 500-200 MG-UNIT per tablet Take 1 tablet by mouth daily.      Marland Kitchen docusate sodium (COLACE) 100 MG capsule Take 100 mg by mouth daily as needed. For stool softner      . Multiple Vitamin (MULTIVITAMIN WITH MINERALS) TABS Take 1 tablet by mouth daily.       No current facility-administered medications for this visit.    Past Medical History  Diagnosis Date  . Prostate cancer   . GERD (gastroesophageal reflux disease)   . NSTEMI (non-ST elevated myocardial infarction) August 2013  . 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 Coronary atherosclerosis of native coronary artery     Status post  CABG - LIMA to LAD, SVG to LCX and SVG to RCA  . CKD (chronic kidney disease) stage 3, GFR 30-59 ml/min     Past Surgical History  Procedure Laterality Date  . Hernia repair  1970's  . Coronary artery bypass graft  11/29/2011    Procedure: CORONARY ARTERY BYPASS GRAFTING (CABG);  Surgeon: Ivin Poot, MD;  Location: [facility];  Service: Open Heart Surgery;  Laterality: N/A;  Coronary artery bypass graft time three using left internal mammary artery and right leg saphenous vein harvested endoscopically. Transesophageal echocardiogram performed intraoperatively.    Social History Mr. Taglieri reports that he has quit smoking. His smoking use included Cigars and Cigarettes. He has a 80 pack-year smoking history. He does not have any smokeless tobacco history on file. Mr. Weathington reports that he drinks about 0.6 ounces of alcohol per week.  Review of Systems No palpitations, unusual dizziness, or syncope. Stable appetite. Bruises easily on aspirin. Otherwise negative.  Physical Examination Filed Vitals:   06/15/13 1404  BP: 132/81  Pulse: 96   Filed Weights   06/15/13 1404  Weight: 191 lb 12.8 oz (87 kg)    Patient appears comfortable at rest.  HEENT: Conjunctiva and lids normal, oropharynx clear.  Neck: Supple, no elevated JVP or carotid bruits, no thyromegaly.  Lungs: Clear to auscultation, nonlabored breathing at rest.  Cardiac: Regular rate and rhythm, no S3 or significant systolic murmur, no pericardial rub.  Abdomen:  Soft, nontender, bowel sounds present.  Extremities: No pitting edema, distal pulses 2+.    Problem List and Plan   Coronary atherosclerosis of native coronary artery Symptomatically stable on medical therapy, ECG reviewed. Continue observation. Recommend basic walking regimen. Reminded him to keep followup physical with [name]. Office visit in 6 months.  Essential hypertension Continues to prefer overall conservative approach, no specific medical therapy.  Recommend sodium restriction, diet and exercise.  CKD (chronic kidney disease), stage III Recent creatinine 1.3.    Satira Sark, M.D., F.A.C.C.

## 2013-09-07 DIAGNOSIS — B079 Viral wart, unspecified: Secondary | ICD-10-CM | POA: Diagnosis not present

## 2013-09-07 DIAGNOSIS — D485 Neoplasm of uncertain behavior of skin: Secondary | ICD-10-CM | POA: Diagnosis not present

## 2013-09-28 DIAGNOSIS — B079 Viral wart, unspecified: Secondary | ICD-10-CM | POA: Diagnosis not present

## 2013-10-13 DIAGNOSIS — D485 Neoplasm of uncertain behavior of skin: Secondary | ICD-10-CM | POA: Diagnosis not present

## 2013-10-13 DIAGNOSIS — C44319 Basal cell carcinoma of skin of other parts of face: Secondary | ICD-10-CM | POA: Diagnosis not present

## 2013-10-13 IMAGING — CR DG CHEST 2V
2 series · 2 of 2 positions shown · non-contrast
Comparison: the previous day's study

CLINICAL DATA: Coronary artery disease

CHEST - 2 VIEW

[w chest pa]
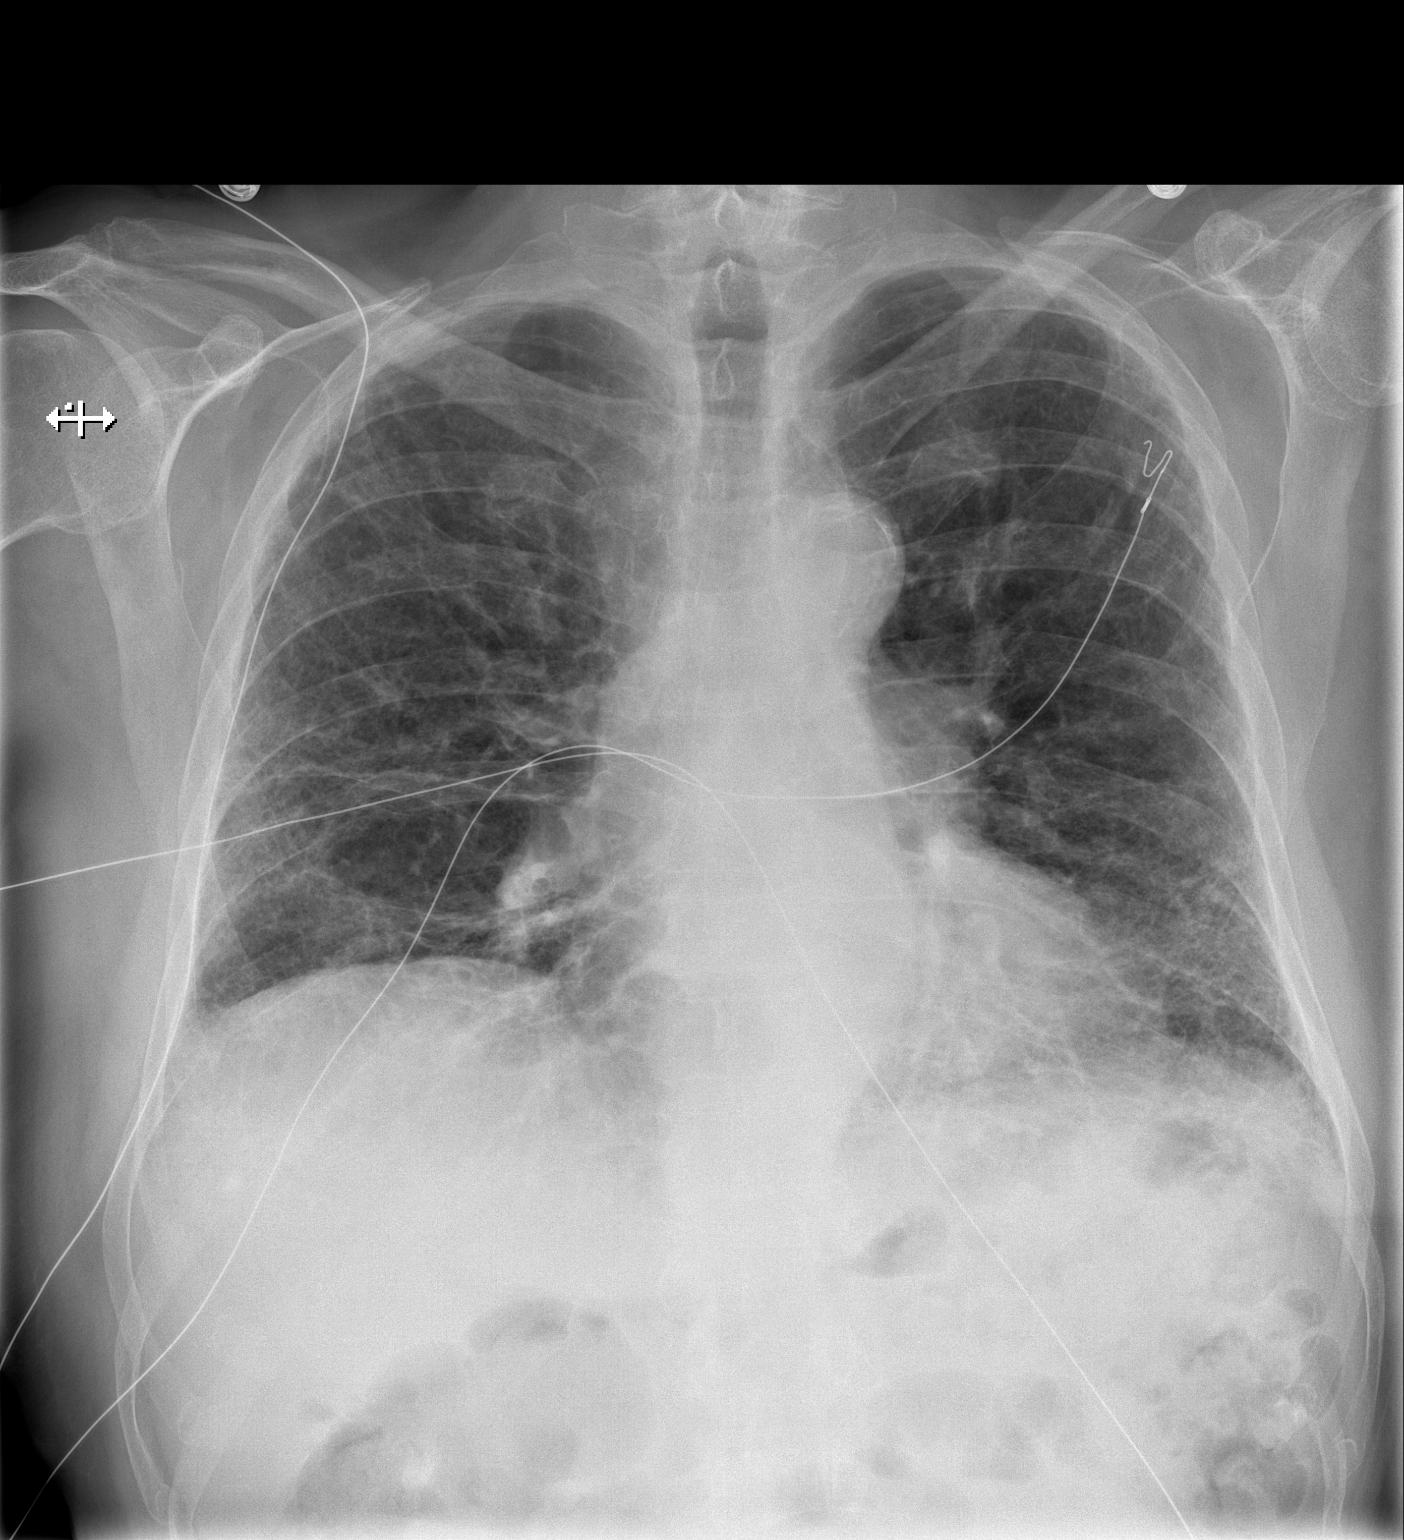

[w chest lat]
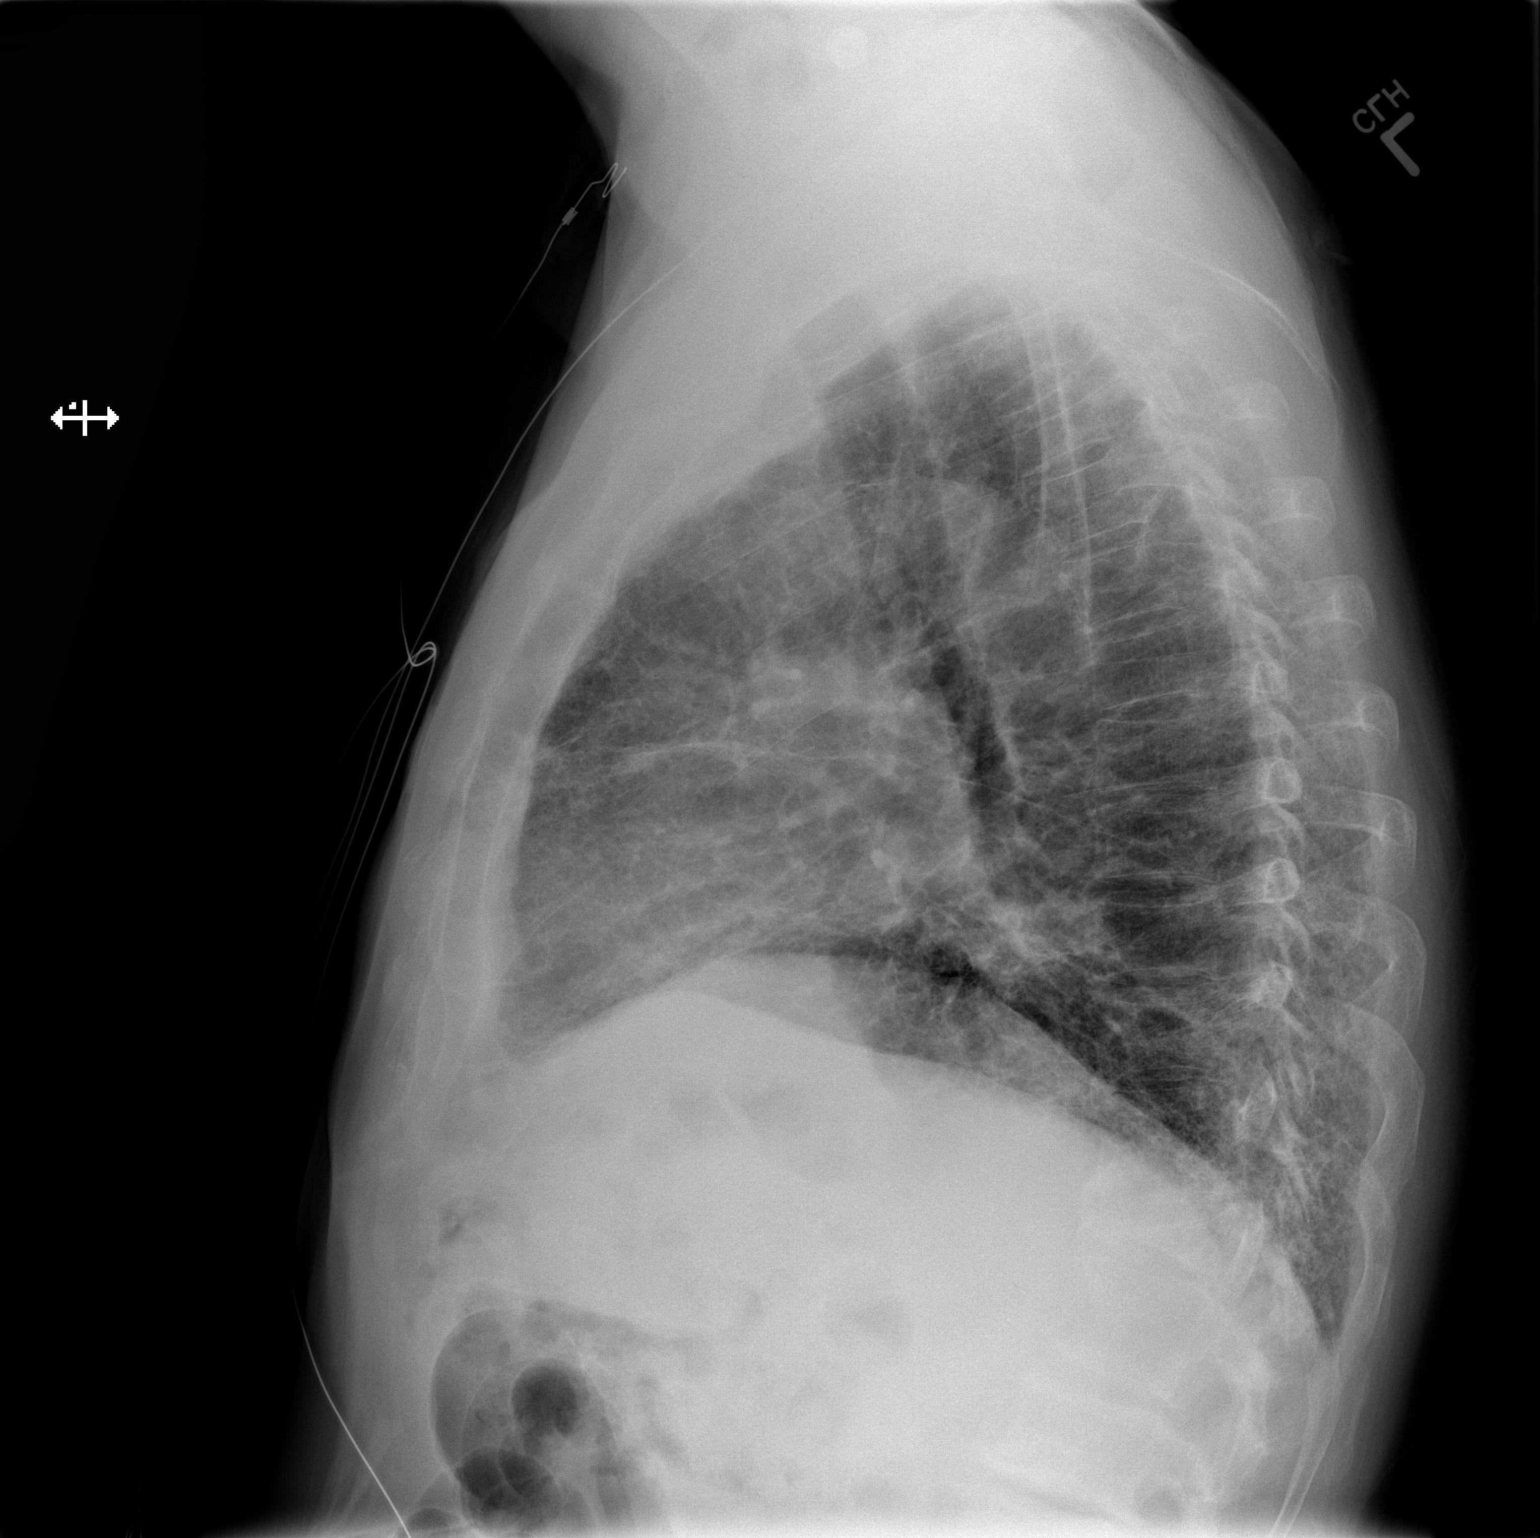

[2 of 2 positions shown; findings below may reference images not displayed]

FINDINGS: The heart size normal.  Atheromatous tortuous thoracic
aorta.  Fibrotic changes in both lower lobes.  No pleural effusion.
Vertebral compression fracture deformity in the lower thoracic
spine, age indeterminate.
IMPRESSION: 1.  Stable appearance since previous exam

## 2013-10-14 IMAGING — CR DG CHEST 1V PORT
1 series · 1 of 1 positions shown · non-contrast
Comparison: Chest x-ray 11/28/2011.

CLINICAL DATA: Status post CABG.

PORTABLE CHEST - 1 VIEW

[AP]
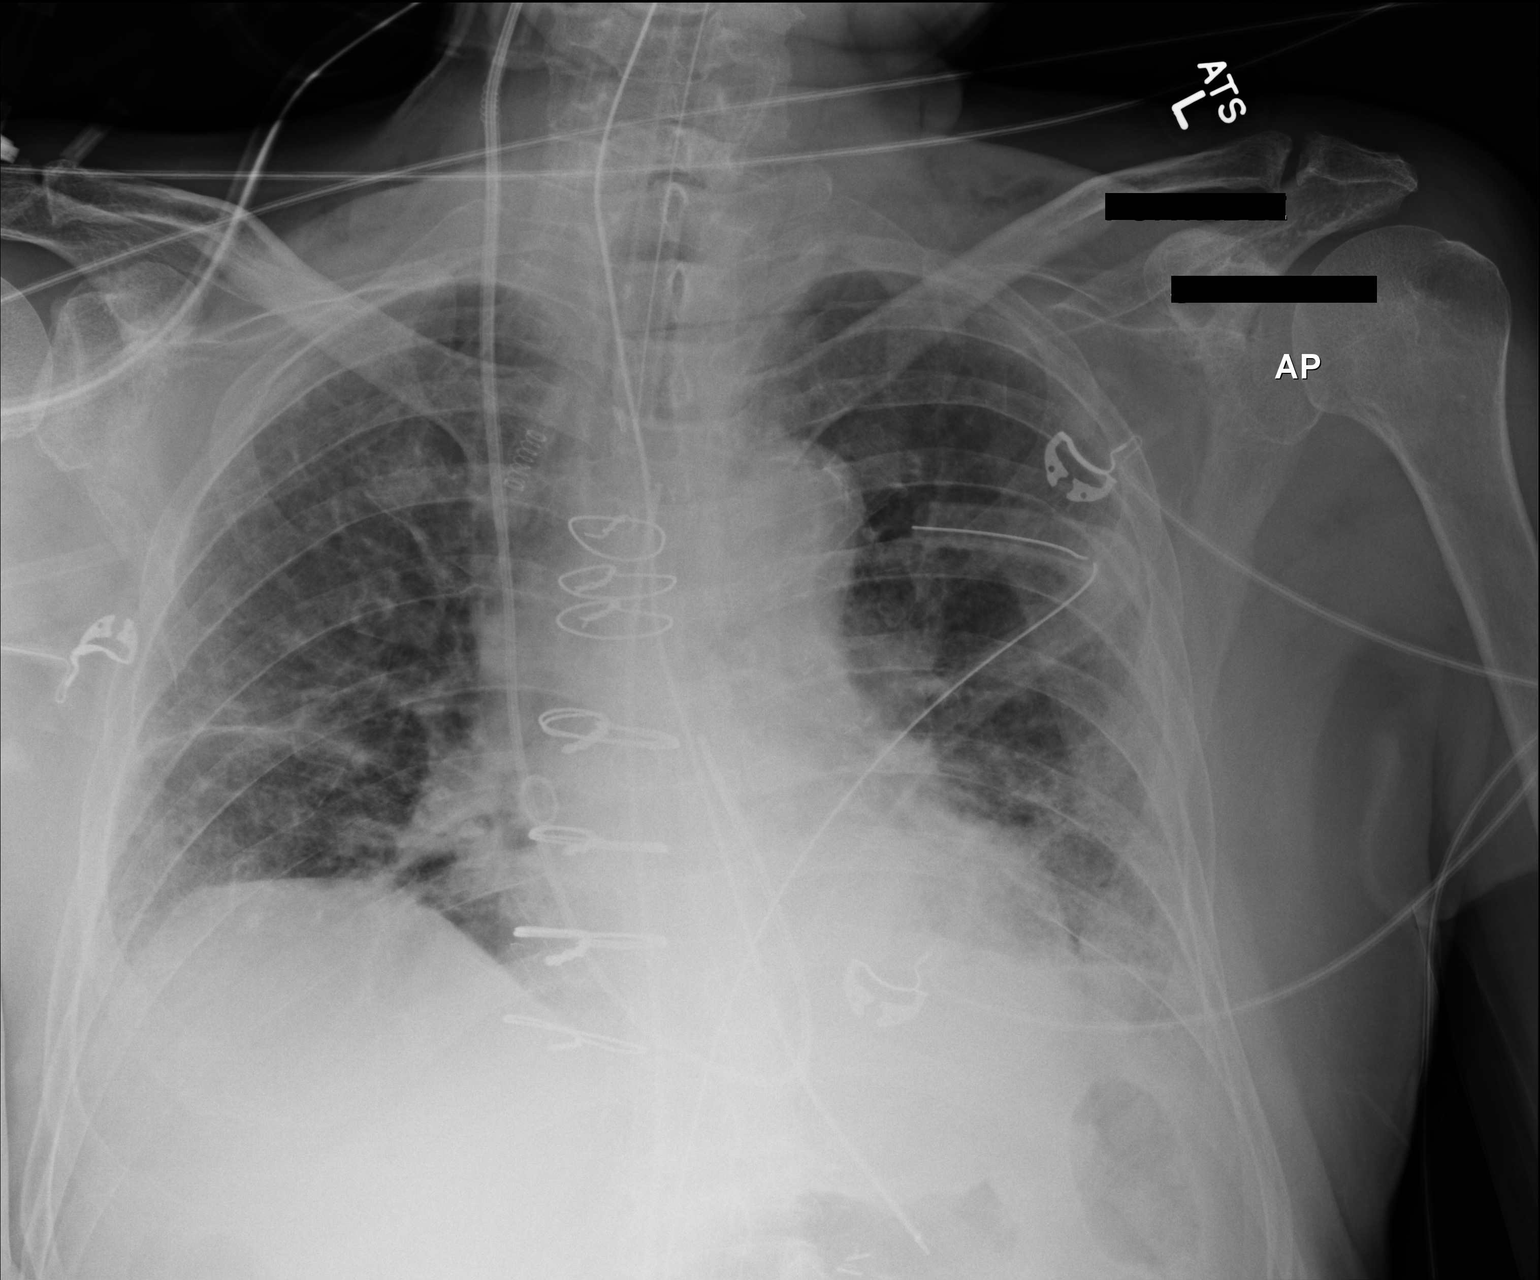

[1 of 1 positions shown; findings below may reference images not displayed]

FINDINGS: An endotracheal tube is in place with tip 4.8 cm above
the carina.  A nasogastric tube is seen extending into the stomach,
however, the tip of the nasogastric tube extends below the lower
margin of the image.  Right IJ central venous Cordis through which
a Swan Ganz catheter has been passed into the pulmonic trunk.  Left-
sided chest tube and mediastinal drains are in position.

Lung volumes are low.  Bibasilar opacities compatible with
postoperative atelectasis.  Small bilateral pleural effusions.
Slight interstitial prominence again noted throughout the periphery
of the lung bases bilaterally, suggestive of underlying pulmonary
fibrosis.  Heart size is within normal limits.  Mediastinal
contours are slightly distorted by patient positioning.
Atherosclerosis in the thoracic aorta.  Status post median
sternotomy for CABG.
IMPRESSION: 1.  Support apparatus, as above.
2. Expected postoperative appearance of the thorax, as above,
following CABG.
3.  Coarse interstitial markings in the periphery of the lungs
bilaterally again suggestive of underlying pulmonary fibrosis.

## 2013-11-13 DIAGNOSIS — H43819 Vitreous degeneration, unspecified eye: Secondary | ICD-10-CM | POA: Diagnosis not present

## 2013-11-13 DIAGNOSIS — H35359 Cystoid macular degeneration, unspecified eye: Secondary | ICD-10-CM | POA: Diagnosis not present

## 2013-11-13 DIAGNOSIS — H35369 Drusen (degenerative) of macula, unspecified eye: Secondary | ICD-10-CM | POA: Diagnosis not present

## 2013-11-17 DIAGNOSIS — M949 Disorder of cartilage, unspecified: Secondary | ICD-10-CM | POA: Diagnosis not present

## 2013-11-17 DIAGNOSIS — Z5181 Encounter for therapeutic drug level monitoring: Secondary | ICD-10-CM | POA: Diagnosis not present

## 2013-11-17 DIAGNOSIS — M810 Age-related osteoporosis without current pathological fracture: Secondary | ICD-10-CM | POA: Diagnosis not present

## 2013-11-17 DIAGNOSIS — M899 Disorder of bone, unspecified: Secondary | ICD-10-CM | POA: Diagnosis not present

## 2013-11-17 DIAGNOSIS — C61 Malignant neoplasm of prostate: Secondary | ICD-10-CM | POA: Diagnosis not present

## 2013-11-17 DIAGNOSIS — Z79899 Other long term (current) drug therapy: Secondary | ICD-10-CM | POA: Diagnosis not present

## 2013-11-20 DIAGNOSIS — C44319 Basal cell carcinoma of skin of other parts of face: Secondary | ICD-10-CM | POA: Diagnosis not present

## 2013-12-04 ENCOUNTER — Ambulatory Visit (INDEPENDENT_AMBULATORY_CARE_PROVIDER_SITE_OTHER): Payer: Medicare Other | Admitting: Cardiology

## 2013-12-04 ENCOUNTER — Encounter: Payer: Self-pay | Admitting: Cardiology

## 2013-12-04 VITALS — BP 122/79 | HR 89 | Ht 70.0 in | Wt 191.0 lb

## 2013-12-04 DIAGNOSIS — I2510 Atherosclerotic heart disease of native coronary artery without angina pectoris: Secondary | ICD-10-CM

## 2013-12-04 DIAGNOSIS — N183 Chronic kidney disease, stage 3 unspecified: Secondary | ICD-10-CM | POA: Diagnosis not present

## 2013-12-04 DIAGNOSIS — I10 Essential (primary) hypertension: Secondary | ICD-10-CM

## 2013-12-04 NOTE — Assessment & Plan Note (Signed)
Symptomatically stable on medical therapy. No changes made today. Encouraged regular walking regimen for exercise. Followup arranged.

## 2013-12-04 NOTE — Assessment & Plan Note (Signed)
Creatinine 1.3 in January.

## 2013-12-04 NOTE — Assessment & Plan Note (Signed)
Blood pressure is normal today. 

## 2013-12-04 NOTE — Progress Notes (Signed)
Clinical Summary [name] [name] is an 80 y.o.male last seen in February. He has been stable from a cardiac perspective, denies any significant angina symptoms, no change in chronic shortness of breath. 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  He reports no problems with his current medications, prefers an overall conservative approach in terms of medical therapy.  Echocardiogram from December 2013 showed LVEF 60-65% with grade 1 diastolic dysfunction, mild left atrial enlargement.  Lab work from January showed BUN 29, creatinine 1.3, normal LFTs, potassium 4.3, hemoglobin 13.1, platelets 273.   Allergies  Allergen Reactions  . Morphine And Related Shortness Of Breath  . Penicillins Shortness Of Breath    Current Outpatient Prescriptions  Medication Sig Dispense Refill  . aspirin 81 MG tablet Take 81 mg by mouth daily.      . calcium-vitamin D (OSCAL WITH D) 500-200 MG-UNIT per tablet Take 1 tablet by mouth daily.      Marland Kitchen docusate sodium (COLACE) 100 MG capsule Take 100 mg by mouth daily as needed. For stool softner      . Multiple Vitamin (MULTIVITAMIN WITH MINERALS) TABS Take 1 tablet by mouth daily.       No current facility-administered medications for this visit.    Past Medical History  Diagnosis Date  . Prostate cancer   . GERD (gastroesophageal reflux disease)   . NSTEMI (non-ST elevated myocardial infarction) August 2013  . 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 Coronary atherosclerosis of native coronary artery     Status post CABG - LIMA to LAD, SVG to LCX and SVG to RCA  . CKD (chronic kidney disease) stage 3, GFR 30-59 ml/min     Past Surgical History  Procedure Laterality Date  . Hernia repair  1970's  . Coronary artery bypass graft  11/29/2011    Procedure: CORONARY ARTERY BYPASS GRAFTING (CABG);  Surgeon: Ivin Poot, MD;  Location: [facility];   Service: Open Heart Surgery;  Laterality: N/A;  Coronary artery bypass graft time three using left internal mammary artery and right leg saphenous vein harvested endoscopically. Transesophageal echocardiogram performed intraoperatively.    Social History [name] [name] reports that he quit smoking about 24 years ago. His smoking use included Cigars and Cigarettes. He has a 80 pack-year smoking history. He does not have any smokeless tobacco history on file. [name] [name] reports that he drinks about .6 ounces of alcohol per week.  Review of Systems Skin cancer surgery in the interim. Cataracts. Other systems reviewed and negative except as outlined.  Physical Examination Filed Vitals:   12/04/13 1044  BP: 122/79  Pulse: 89   Filed Weights   12/04/13 1044  Weight: 191 lb (86.637 kg)    Patient appears comfortable at rest.  HEENT: Conjunctiva and lids normal, oropharynx clear.  Neck: Supple, no elevated JVP or carotid bruits, no thyromegaly.  Lungs: Clear to auscultation, nonlabored breathing at rest.  Cardiac: Regular rate and rhythm, no S3 or significant systolic murmur, no pericardial rub.  Abdomen: Soft, nontender, bowel sounds present.  Extremities: No pitting edema, distal pulses 2+.    Problem List and Plan   Coronary atherosclerosis of native coronary artery Symptomatically stable on medical therapy. No changes made today. Encouraged regular walking regimen for exercise. Followup arranged.  Essential hypertension Blood pressure is normal today.  CKD (chronic kidney disease),  stage III Creatinine 1.3 in January.    Satira Sark, M.D., F.A.C.C.

## 2013-12-04 NOTE — Patient Instructions (Signed)
Continue all current medications. Your physician wants you to follow up in: 6 months.  You will receive a reminder letter in the mail one-two months in advance.  If you don't receive a letter, please call our office to schedule the follow up appointment   

## 2013-12-26 DIAGNOSIS — H2510 Age-related nuclear cataract, unspecified eye: Secondary | ICD-10-CM | POA: Diagnosis not present

## 2013-12-26 DIAGNOSIS — H538 Other visual disturbances: Secondary | ICD-10-CM | POA: Diagnosis not present

## 2013-12-26 DIAGNOSIS — Z961 Presence of intraocular lens: Secondary | ICD-10-CM | POA: Diagnosis not present

## 2014-01-04 DIAGNOSIS — H538 Other visual disturbances: Secondary | ICD-10-CM | POA: Diagnosis not present

## 2014-01-04 DIAGNOSIS — H2510 Age-related nuclear cataract, unspecified eye: Secondary | ICD-10-CM | POA: Diagnosis not present

## 2014-01-08 DIAGNOSIS — Z951 Presence of aortocoronary bypass graft: Secondary | ICD-10-CM | POA: Diagnosis not present

## 2014-01-08 DIAGNOSIS — I252 Old myocardial infarction: Secondary | ICD-10-CM | POA: Diagnosis not present

## 2014-01-08 DIAGNOSIS — H5210 Myopia, unspecified eye: Secondary | ICD-10-CM | POA: Diagnosis not present

## 2014-01-08 DIAGNOSIS — H348392 Tributary (branch) retinal vein occlusion, unspecified eye, stable: Secondary | ICD-10-CM | POA: Diagnosis not present

## 2014-01-08 DIAGNOSIS — Z8546 Personal history of malignant neoplasm of prostate: Secondary | ICD-10-CM | POA: Diagnosis not present

## 2014-01-08 DIAGNOSIS — H538 Other visual disturbances: Secondary | ICD-10-CM | POA: Diagnosis not present

## 2014-01-08 DIAGNOSIS — H2510 Age-related nuclear cataract, unspecified eye: Secondary | ICD-10-CM | POA: Diagnosis not present

## 2014-01-08 DIAGNOSIS — H52209 Unspecified astigmatism, unspecified eye: Secondary | ICD-10-CM | POA: Diagnosis not present

## 2014-01-08 DIAGNOSIS — Z961 Presence of intraocular lens: Secondary | ICD-10-CM | POA: Diagnosis not present

## 2014-01-08 DIAGNOSIS — H524 Presbyopia: Secondary | ICD-10-CM | POA: Diagnosis not present

## 2014-01-08 DIAGNOSIS — Z79899 Other long term (current) drug therapy: Secondary | ICD-10-CM | POA: Diagnosis not present

## 2014-01-08 DIAGNOSIS — Z8639 Personal history of other endocrine, nutritional and metabolic disease: Secondary | ICD-10-CM | POA: Diagnosis not present

## 2014-01-08 DIAGNOSIS — H269 Unspecified cataract: Secondary | ICD-10-CM | POA: Diagnosis not present

## 2014-01-08 DIAGNOSIS — Z862 Personal history of diseases of the blood and blood-forming organs and certain disorders involving the immune mechanism: Secondary | ICD-10-CM | POA: Diagnosis not present

## 2014-01-08 DIAGNOSIS — Z7982 Long term (current) use of aspirin: Secondary | ICD-10-CM | POA: Diagnosis not present

## 2014-02-15 DIAGNOSIS — M859 Disorder of bone density and structure, unspecified: Secondary | ICD-10-CM | POA: Diagnosis not present

## 2014-02-15 DIAGNOSIS — Z13820 Encounter for screening for osteoporosis: Secondary | ICD-10-CM | POA: Diagnosis not present

## 2014-04-05 ENCOUNTER — Encounter (HOSPITAL_COMMUNITY): Payer: Self-pay | Admitting: Cardiology

## 2014-04-17 DIAGNOSIS — L570 Actinic keratosis: Secondary | ICD-10-CM | POA: Diagnosis not present

## 2014-04-17 DIAGNOSIS — Z85828 Personal history of other malignant neoplasm of skin: Secondary | ICD-10-CM | POA: Diagnosis not present

## 2014-04-17 DIAGNOSIS — L821 Other seborrheic keratosis: Secondary | ICD-10-CM | POA: Diagnosis not present

## 2014-05-15 DIAGNOSIS — Z79899 Other long term (current) drug therapy: Secondary | ICD-10-CM | POA: Diagnosis not present

## 2014-05-15 DIAGNOSIS — M8580 Other specified disorders of bone density and structure, unspecified site: Secondary | ICD-10-CM | POA: Diagnosis not present

## 2014-05-15 DIAGNOSIS — Z5181 Encounter for therapeutic drug level monitoring: Secondary | ICD-10-CM | POA: Diagnosis not present

## 2014-05-15 DIAGNOSIS — C61 Malignant neoplasm of prostate: Secondary | ICD-10-CM | POA: Diagnosis not present

## 2014-05-23 DIAGNOSIS — R972 Elevated prostate specific antigen [PSA]: Secondary | ICD-10-CM | POA: Diagnosis not present

## 2014-05-23 DIAGNOSIS — M8580 Other specified disorders of bone density and structure, unspecified site: Secondary | ICD-10-CM | POA: Diagnosis not present

## 2014-05-23 DIAGNOSIS — C61 Malignant neoplasm of prostate: Secondary | ICD-10-CM | POA: Diagnosis not present

## 2014-06-14 ENCOUNTER — Encounter: Payer: Self-pay | Admitting: Cardiology

## 2014-06-14 ENCOUNTER — Ambulatory Visit (INDEPENDENT_AMBULATORY_CARE_PROVIDER_SITE_OTHER): Payer: Medicare Other | Admitting: Cardiology

## 2014-06-14 VITALS — BP 122/82 | HR 81 | Ht 69.0 in | Wt 198.0 lb

## 2014-06-14 DIAGNOSIS — I10 Essential (primary) hypertension: Secondary | ICD-10-CM

## 2014-06-14 DIAGNOSIS — I2510 Atherosclerotic heart disease of native coronary artery without angina pectoris: Secondary | ICD-10-CM | POA: Diagnosis not present

## 2014-06-14 DIAGNOSIS — Z8679 Personal history of other diseases of the circulatory system: Secondary | ICD-10-CM

## 2014-06-14 NOTE — Progress Notes (Signed)
Cardiology Office Note  Date: 06/14/2014   ID: [name] Luis Sr., DOB 11-17-1926, MRN [id number]  PCP: [name] [name]., MD  Primary Cardiologist: [name] Lesches, MD   Chief Complaint  Patient presents with  . Coronary Artery Disease  . History of cardiomyopathy    History of Present Illness: [name] [name]. is an 81 y.o. male last seen in August 2015. He presents for a follow-up visit. No specific complaints voiced today, he has had no angina and reports NYHA class II dyspnea. 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 He reports no change in overall functional status or ADLs.  We reviewed his medications. He has preferred a very conservative approach overall, essentially only on aspirin daily at this time.  ECG done today is reviewed below.   Past Medical History  Diagnosis Date  . Prostate cancer   . GERD (gastroesophageal reflux disease)   . NSTEMI (non-ST elevated myocardial infarction) August 2013  . 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 Coronary atherosclerosis of native coronary artery     Status post CABG - LIMA to LAD, SVG to LCX and SVG to RCA  . CKD (chronic kidney disease) stage 3, GFR 30-59 ml/min     Past Surgical History  Procedure Laterality Date  . Hernia repair  1970's  . Coronary artery bypass graft  11/29/2011    Procedure: CORONARY ARTERY BYPASS GRAFTING (CABG);  Surgeon: [name] Poot, MD;  Location: [facility];  Service: Open Heart Surgery;  Laterality: N/A;  Coronary artery bypass graft time three using left internal mammary artery and right leg saphenous vein harvested endoscopically. Transesophageal echocardiogram performed intraoperatively.  . Left heart catheterization with coronary angiogram N/A 11/27/2011    Procedure: LEFT HEART CATHETERIZATION WITH CORONARY ANGIOGRAM;  Surgeon: [name] Bow, MD;  Location: [facility] C[facility]  LAB;  Service: Cardiovascular;  Laterality: N/A;     Current Outpatient Prescriptions  Medication Sig Dispense Refill  . calcium-vitamin D (OSCAL WITH D) 500-200 MG-UNIT per tablet Take 1 tablet by mouth daily.    Marland Kitchen docusate sodium (COLACE) 100 MG capsule Take 100 mg by mouth daily as needed. For stool softner    . Multiple Vitamin (MULTIVITAMIN WITH MINERALS) TABS Take 1 tablet by mouth daily.    Marland Kitchen aspirin 81 MG tablet Take 81 mg by mouth daily. Pt has not taken in 2 months 06/14/14     No current facility-administered medications for this visit.    Allergies:  Morphine and related and Penicillins   Social History: The patient  reports that he quit smoking about 25 years ago. His smoking use included Cigars and Cigarettes. He has a 80 pack-year smoking history. He has never used smokeless tobacco. He reports that he drinks about 0.6 oz of alcohol per week. He reports that he does not use illicit drugs.   ROS:  Please see the history of present illness. Otherwise, complete review of systems is positive for none.  All other systems are reviewed and negative.    Physical Exam: VS:  BP 122/82 mmHg  Pulse 81  Ht 5\' 9"  (1.753 m)  Wt 198 lb (89.812 kg)  BMI 29.23 kg/m2  SpO2 92%, BMI Body mass index is 29.23 kg/(m^2).  Wt Readings from Last 3 Encounters:  06/14/14 198 lb (89.812 kg)  12/04/13 191 lb (86.637  kg)  06/15/13 191 lb 12.8 oz (87 kg)     General: Patient appears comfortable at rest. HEENT: Conjunctiva and lids normal, oropharynx clear with moist mucosa. Neck: Supple, no elevated JVP or carotid bruits, no thyromegaly. Lungs: Clear to auscultation, nonlabored breathing at rest. Cardiac: Regular rate and rhythm, no S3 or significant systolic murmur, no pericardial rub. Abdomen: Soft, nontender, no hepatomegaly, bowel sounds present, no guarding or rebound. Extremities: No pitting edema, distal pulses 2+. Skin: Warm and dry. Musculoskeletal: No kyphosis. Neuropsychiatric:  Alert and oriented x3, affect grossly appropriate.   ECG: ECG is ordered today and reviewed showing sinus rhythm with left anterior fascicular block, decreased anterior R-wave progression, nonspecific ST-T changes.   Recent Labwork:  Lab work from January 2015 showed BUN 29, creatinine 1.3, AST 21, ALT 16, potassium 4.3, hemoglobin 13.1, platelets 273.  Other Studies Reviewed Today:  Echocardiogram 04/06/2012: Study Conclusions  - Left ventricle: The cavity size was normal. Wall thickness was normal. Systolic function was normal. The estimated ejection fraction was in the range of 60% to 65%. Hypokinesis of the basalinferoseptal myocardium. There was an increased relative contribution of atrial contraction to ventricular filling. Doppler parameters are consistent with abnormal left ventricular relaxation (grade 1 diastolic dysfunction). - Left atrium: The atrium was mildly dilated. - Atrial septum: No defect or patent foramen ovale was identified.  Assessment and Plan:  1. Multivessel CAD status post CABG in 2013 as detailed above. He remains clinically stable on limited medical therapy, essentially only taking an aspirin daily. He has preferred a conservative approach overall from a medication perspective.  2. History of cardiomyopathy with subsequent normalization of LVEF to the range of 60-65%. No active heart failure symptoms.  3. CKD, stage 3. Lab work from last year showed creatinine 1.3. Keep follow-up with [name].  Current medicines are reviewed at length with the patient today.  The patient has concerns regarding medicines. We did discuss aspirin which he had not been taking in the last few months. He reported no intolerances, just concerned about long-term use. I recommended that he take a baby aspirin after food daily in light of his cardiovascular history, anticipated benefits outweighing the potential risks.   Orders Placed This Encounter    Procedures  . EKG 12-Lead    Disposition: FU with me in 6 months.   Signed, [name] Sark, MD, [facility] 06/14/2014 12:11 PM    [facility] at [place], [place], [place] [address] Phone: [phone]; Fax: [phone]

## 2014-06-14 NOTE — Patient Instructions (Signed)

## 2014-08-13 DIAGNOSIS — H4720 Unspecified optic atrophy: Secondary | ICD-10-CM | POA: Diagnosis not present

## 2014-08-13 DIAGNOSIS — H35351 Cystoid macular degeneration, right eye: Secondary | ICD-10-CM | POA: Diagnosis not present

## 2014-08-13 DIAGNOSIS — H35361 Drusen (degenerative) of macula, right eye: Secondary | ICD-10-CM | POA: Diagnosis not present

## 2014-08-13 DIAGNOSIS — H35362 Drusen (degenerative) of macula, left eye: Secondary | ICD-10-CM | POA: Diagnosis not present

## 2014-09-14 DIAGNOSIS — C61 Malignant neoplasm of prostate: Secondary | ICD-10-CM | POA: Diagnosis not present

## 2014-09-28 DIAGNOSIS — R972 Elevated prostate specific antigen [PSA]: Secondary | ICD-10-CM | POA: Diagnosis not present

## 2014-09-28 DIAGNOSIS — C61 Malignant neoplasm of prostate: Secondary | ICD-10-CM | POA: Diagnosis not present

## 2014-09-28 DIAGNOSIS — M8580 Other specified disorders of bone density and structure, unspecified site: Secondary | ICD-10-CM | POA: Diagnosis not present

## 2014-10-18 DIAGNOSIS — L821 Other seborrheic keratosis: Secondary | ICD-10-CM | POA: Diagnosis not present

## 2014-10-18 DIAGNOSIS — Z85828 Personal history of other malignant neoplasm of skin: Secondary | ICD-10-CM | POA: Diagnosis not present

## 2014-12-11 ENCOUNTER — Encounter: Payer: Self-pay | Admitting: Cardiology

## 2014-12-11 ENCOUNTER — Ambulatory Visit (INDEPENDENT_AMBULATORY_CARE_PROVIDER_SITE_OTHER): Payer: Medicare Other | Admitting: Cardiology

## 2014-12-11 VITALS — BP 118/70 | HR 94 | Ht 69.0 in | Wt 182.0 lb

## 2014-12-11 DIAGNOSIS — Z8679 Personal history of other diseases of the circulatory system: Secondary | ICD-10-CM | POA: Diagnosis not present

## 2014-12-11 DIAGNOSIS — I2510 Atherosclerotic heart disease of native coronary artery without angina pectoris: Secondary | ICD-10-CM | POA: Diagnosis not present

## 2014-12-11 NOTE — Progress Notes (Signed)
Cardiology Office Note  Date: 12/11/2014   ID: [name] Round Sr., DOB [birth year], MRN [id number]  PCP: Ignatius Specking., MD  Primary Cardiologist: Nona Dell, MD   Chief Complaint  Patient presents with  . Coronary Artery Disease    History of Present Illness: [name] CRILLY Sr. is an 81 y.o. male last seen in February. He comes in for a routine follow-up visit. Since we last met, he does not endorse having had any angina symptoms or progressive shortness of breath with his typical activities. 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  We reviewed his medications, he continues to follow-up with Dr. Sherril Croon. As noted previously, he has preferred a fairly conservative approach to his cardiac management, is on aspirin alone.   Past Medical History  Diagnosis Date  . Prostate cancer   . GERD (gastroesophageal reflux disease)   . NSTEMI (non-ST elevated myocardial infarction) August 2013  . 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 Coronary atherosclerosis of native coronary artery     Status post CABG - LIMA to LAD, SVG to LCX and SVG to RCA  . CKD (chronic kidney disease) stage 3, GFR 30-59 ml/min     Current Outpatient Prescriptions  Medication Sig Dispense Refill  . aspirin 81 MG tablet Take 81 mg by mouth daily. Pt has not taken in 2 months 06/14/14    . calcium-vitamin D (OSCAL WITH D) 500-200 MG-UNIT per tablet Take 1 tablet by mouth daily.    Marland Kitchen docusate sodium (COLACE) 100 MG capsule Take 100 mg by mouth daily as needed. For stool softner    . Multiple Vitamin (MULTIVITAMIN WITH MINERALS) TABS Take 1 tablet by mouth daily.     No current facility-administered medications for this visit.    Allergies:  Morphine and related and Penicillins   Social History: The patient  reports that he quit smoking about 25 years ago.  His smoking use included Cigars and Cigarettes. He has a 80 pack-year smoking history. He has never used smokeless tobacco. He reports that he drinks about 0.6 oz of alcohol per week. He reports that he does not use illicit drugs.   ROS:  Please see the history of present illness. Otherwise, complete review of systems is positive for arthritic pains occasionally.  All other systems are reviewed and negative.   Physical Exam: VS:  BP 118/70 mmHg  Pulse 94  Ht 5\' 9"  (1.753 m)  Wt 182 lb (82.555 kg)  BMI 26.86 kg/m2  SpO2 91%, BMI Body mass index is 26.86 kg/(m^2).  Wt Readings from Last 3 Encounters:  12/11/14 182 lb (82.555 kg)  06/14/14 198 lb (89.812 kg)  12/04/13 191 lb (86.637 kg)     General: Patient appears comfortable at rest. HEENT: Conjunctiva and lids normal, oropharynx clear with moist mucosa. Neck: Supple, no elevated JVP or carotid bruits, no thyromegaly. Lungs: Clear to auscultation, nonlabored breathing at rest. Cardiac: Regular rate and rhythm, no S3 or significant systolic murmur, no pericardial rub. Abdomen: Soft, nontender, no hepatomegaly, bowel sounds present, no guarding or rebound. Extremities: No pitting edema, distal pulses 2+.  ECG: Tracing from 06/14/2014 showed sinus rhythm with left anterior fascicular block and decreased R wave progression.  Other Studies Reviewed Today:  Echocardiogram 04/06/2012: Study Conclusions  - Left ventricle: The  cavity size was normal. Wall thickness was normal. Systolic function was normal. The estimated ejection fraction was in the range of 60% to 65%. Hypokinesis of the basalinferoseptal myocardium. There was an increased relative contribution of atrial contraction to ventricular filling. Doppler parameters are consistent with abnormal left ventricular relaxation (grade 1 diastolic dysfunction). - Left atrium: The atrium was mildly dilated. - Atrial septum: No defect or patent foramen ovale  was identified.   Assessment and Plan:  1. Multivessel CAD status post CABG. He has been symptomatically stable on aspirin, prefers overall conservative management of his cardiovascular disease. I have encouraged regular activity as tolerated. Follow-up arranged.  2. History of cardiomyopathy with previously documented normalization of LVEF.  Current medicines were reviewed with the patient today.  Disposition: FU with me in 6 months.   Signed, Satira Sark, MD, [facility] 12/11/2014 2:17 PM    [facility] at [place], [place], [place] [address] Phone: [phone]; Fax: [phone]

## 2014-12-11 NOTE — Patient Instructions (Signed)
Your physician recommends that you continue on your current medications as directed. Please refer to the Current Medication list given to you today. Your physician recommends that you schedule a follow-up appointment in: 6 months. You will receive a reminder letter in the mail in about 4 months reminding you to call and schedule your appointment. If you don't receive this letter, please contact our office. 

## 2015-01-16 DIAGNOSIS — H3531 Nonexudative age-related macular degeneration: Secondary | ICD-10-CM | POA: Diagnosis not present

## 2015-01-16 DIAGNOSIS — H538 Other visual disturbances: Secondary | ICD-10-CM | POA: Diagnosis not present

## 2015-01-28 DIAGNOSIS — H01002 Unspecified blepharitis right lower eyelid: Secondary | ICD-10-CM | POA: Diagnosis not present

## 2015-01-28 DIAGNOSIS — H01004 Unspecified blepharitis left upper eyelid: Secondary | ICD-10-CM | POA: Diagnosis not present

## 2015-01-28 DIAGNOSIS — H01001 Unspecified blepharitis right upper eyelid: Secondary | ICD-10-CM | POA: Diagnosis not present

## 2015-03-20 DIAGNOSIS — C61 Malignant neoplasm of prostate: Secondary | ICD-10-CM | POA: Diagnosis not present

## 2015-03-29 DIAGNOSIS — R972 Elevated prostate specific antigen [PSA]: Secondary | ICD-10-CM | POA: Diagnosis not present

## 2015-03-29 DIAGNOSIS — N289 Disorder of kidney and ureter, unspecified: Secondary | ICD-10-CM | POA: Diagnosis not present

## 2015-03-29 DIAGNOSIS — C61 Malignant neoplasm of prostate: Secondary | ICD-10-CM | POA: Diagnosis not present

## 2015-03-29 DIAGNOSIS — Z9079 Acquired absence of other genital organ(s): Secondary | ICD-10-CM | POA: Diagnosis not present

## 2015-03-29 DIAGNOSIS — M8580 Other specified disorders of bone density and structure, unspecified site: Secondary | ICD-10-CM | POA: Diagnosis not present

## 2015-04-17 DIAGNOSIS — D2230 Melanocytic nevi of unspecified part of face: Secondary | ICD-10-CM | POA: Diagnosis not present

## 2015-04-17 DIAGNOSIS — D225 Melanocytic nevi of trunk: Secondary | ICD-10-CM | POA: Diagnosis not present

## 2015-04-17 DIAGNOSIS — D1801 Hemangioma of skin and subcutaneous tissue: Secondary | ICD-10-CM | POA: Diagnosis not present

## 2015-04-17 DIAGNOSIS — Z85828 Personal history of other malignant neoplasm of skin: Secondary | ICD-10-CM | POA: Diagnosis not present

## 2015-04-17 DIAGNOSIS — L821 Other seborrheic keratosis: Secondary | ICD-10-CM | POA: Diagnosis not present

## 2015-06-13 ENCOUNTER — Encounter: Payer: Medicare Other | Admitting: Cardiology

## 2015-06-13 ENCOUNTER — Encounter: Payer: Self-pay | Admitting: Cardiology

## 2015-06-13 NOTE — Progress Notes (Signed)
No show  This encounter was created in error - please disregard.

## 2015-08-08 ENCOUNTER — Encounter: Payer: Self-pay | Admitting: Cardiology

## 2015-08-08 ENCOUNTER — Ambulatory Visit (INDEPENDENT_AMBULATORY_CARE_PROVIDER_SITE_OTHER): Payer: Medicare Other | Admitting: Cardiology

## 2015-08-08 VITALS — BP 133/84 | HR 73 | Ht 69.0 in | Wt 182.0 lb

## 2015-08-08 DIAGNOSIS — Z8679 Personal history of other diseases of the circulatory system: Secondary | ICD-10-CM

## 2015-08-08 DIAGNOSIS — I2510 Atherosclerotic heart disease of native coronary artery without angina pectoris: Secondary | ICD-10-CM

## 2015-08-08 NOTE — Patient Instructions (Signed)
Your physician recommends that you continue on your current medications as directed. Please refer to the Current Medication list given to you today. Your physician recommends that you schedule a follow-up appointment in: 6 months. You will receive a reminder letter in the mail in about 4 months reminding you to call and schedule your appointment. If you don't receive this letter, please contact our office. 

## 2015-08-08 NOTE — Progress Notes (Signed)
Cardiology Office Note  Date: 08/08/2015   ID: [name], DOB 1926/05/22, MRN [id number]  PCP: [name]., MD  Primary Cardiologist: [name], MD   Chief Complaint  Patient presents with  . Coronary Artery Disease    History of Present Illness: TAEKWON MISCHKE Sr. is an 82 y.o. male last seen in August 2016. He presents for a routine follow-up visit. Overall no change in stamina, does not report any angina symptoms with typical activities. 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 He says that he is thankful for every day, particular when he sees some of the poor health conditions of his friends.  He remains on aspirin, otherwise no regular cardiac medications. He prefers to keep things as simple as possible. I reviewed his ECG today which shows sinus rhythm with left anterior fascicular block and nonspecific ST/T changes.  Past Medical History  Diagnosis Date  . Prostate cancer ([place])   . GERD (gastroesophageal reflux disease)   . NSTEMI (non-ST elevated myocardial infarction) [facility]) August 2013  . Secondary cardiomyopathy ([place]) August 2013    LVEF 30-35% at time of infarct, improved to 60-65% as of 03/2012   . Coronary atherosclerosis of native coronary artery     Status post CABG - LIMA to LAD, SVG to LCX and SVG to RCA  . CKD (chronic kidney disease) stage 3, GFR 30-59 ml/min     Past Surgical History  Procedure Laterality Date  . Hernia repair  1970's  . Coronary artery bypass graft  11/29/2011    Procedure: CORONARY ARTERY BYPASS GRAFTING (CABG);  Surgeon: Ivin Poot, MD;  Location: [place];  Service: Open Heart Surgery;  Laterality: N/A;  Coronary artery bypass graft time three using left internal mammary artery and right leg saphenous vein harvested endoscopically. Transesophageal echocardiogram performed intraoperatively.  . Left heart catheterization with coronary angiogram N/A 11/27/2011    Procedure: LEFT HEART  CATHETERIZATION WITH CORONARY ANGIOGRAM;  Surgeon: Hillary Bow, MD;  Location: [facility] CATH LAB;  Service: Cardiovascular;  Laterality: N/A;    Current Outpatient Prescriptions  Medication Sig Dispense Refill  . calcium-vitamin D (OSCAL WITH D) 500-200 MG-UNIT per tablet Take 1 tablet by mouth daily.    Marland Kitchen docusate sodium (COLACE) 100 MG capsule Take 100 mg by mouth daily as needed. For stool softner    . Multiple Vitamin (MULTIVITAMIN WITH MINERALS) TABS Take 1 tablet by mouth daily.    Marland Kitchen aspirin 81 MG tablet Take 81 mg by mouth daily. Reported on 08/08/2015     No current facility-administered medications for this visit.   Allergies:  Morphine and related and Penicillins   Social History: The patient  reports that he quit smoking about 26 years ago. His smoking use included Cigars and Cigarettes. He has a 80 pack-year smoking history. He has never used smokeless tobacco. He reports that he drinks about 0.6 oz of alcohol per week. He reports that he does not use illicit drugs.   ROS:  Please see the history of present illness. Otherwise, complete review of systems is positive for decreased hearing, mild seasonal allergies.  All other systems are reviewed and negative.   Physical Exam: VS:  BP 133/84 mmHg  Pulse 73  Ht 5\' 9"  (1.753 m)  Wt 182 lb (82.555 kg)  BMI 26.86 kg/m2  SpO2 94%, BMI Body mass index is 26.86 kg/(m^2).  Wt Readings from Last 3 Encounters:  08/08/15 182 lb (  82.555 kg)  12/11/14 182 lb (82.555 kg)  06/14/14 198 lb (89.812 kg)    General: Patient appears comfortable at rest. HEENT: Conjunctiva and lids normal, oropharynx clear with moist mucosa. Neck: Supple, no elevated JVP or carotid bruits, no thyromegaly. Lungs: Clear to auscultation, nonlabored breathing at rest. Cardiac: Regular rate and rhythm, no S3 or significant systolic murmur, no pericardial rub. Abdomen: Soft, nontender, no hepatomegaly, bowel sounds present, no guarding or rebound. Extremities: No  pitting edema, distal pulses 2+.  ECG: I personally reviewed the previous tracing from 06/14/2014 which showed sinus rhythm with left anterior fascicular block and decreased R wave progression.  Recent Labwork:  January 2015: BUN 29, creatinine 1.4, potassium 4.3, AST 21, ALT 16, hemoglobin 13.1, platelets 273  Other Studies Reviewed Today:  Echocardiogram 04/06/2012: Study Conclusions  - Left ventricle: The cavity size was normal. Wall thickness was normal. Systolic function was normal. The estimated ejection fraction was in the range of 60% to 65%. Hypokinesis of the basalinferoseptal myocardium. There was an increased relative contribution of atrial contraction to ventricular filling. Doppler parameters are consistent with abnormal left ventricular relaxation (grade 1 diastolic dysfunction). - Left atrium: The atrium was mildly dilated. - Atrial septum: No defect or patent foramen ovale was identified.  Assessment and Plan:  1. CAD status post CABG in 2013. He continues to do relatively well without major functional decline as noted above. We continue observation, he remains on aspirin, otherwise preferring no other regular cardiac medications.  2. History of cardiomyopathy with improvement in LVEF by last evaluation. He does not report any heart failure symptoms.  Current medicines were reviewed with the patient today.   Orders Placed This Encounter  Procedures  . EKG 12-Lead    Disposition: FU with me in 6 months.   Signed, Satira Sark, MD, [facility] 08/08/2015 11:10 AM    [place] at [place], [place], [place] [address] Phone: [phone]; Fax: [phone]

## 2015-08-12 DIAGNOSIS — H35351 Cystoid macular degeneration, right eye: Secondary | ICD-10-CM | POA: Diagnosis not present

## 2015-08-12 DIAGNOSIS — H4720 Unspecified optic atrophy: Secondary | ICD-10-CM | POA: Diagnosis not present

## 2015-08-12 DIAGNOSIS — H35041 Retinal micro-aneurysms, unspecified, right eye: Secondary | ICD-10-CM | POA: Diagnosis not present

## 2015-08-12 DIAGNOSIS — H31009 Unspecified chorioretinal scars, unspecified eye: Secondary | ICD-10-CM | POA: Diagnosis not present

## 2015-08-12 DIAGNOSIS — H348312 Tributary (branch) retinal vein occlusion, right eye, stable: Secondary | ICD-10-CM | POA: Diagnosis not present

## 2015-09-27 DIAGNOSIS — C61 Malignant neoplasm of prostate: Secondary | ICD-10-CM | POA: Diagnosis not present

## 2015-09-27 DIAGNOSIS — M8580 Other specified disorders of bone density and structure, unspecified site: Secondary | ICD-10-CM | POA: Diagnosis not present

## 2015-09-27 DIAGNOSIS — Z9079 Acquired absence of other genital organ(s): Secondary | ICD-10-CM | POA: Diagnosis not present

## 2015-09-27 DIAGNOSIS — R972 Elevated prostate specific antigen [PSA]: Secondary | ICD-10-CM | POA: Diagnosis not present

## 2015-12-11 ENCOUNTER — Ambulatory Visit (INDEPENDENT_AMBULATORY_CARE_PROVIDER_SITE_OTHER): Payer: Medicare Other | Admitting: Cardiology

## 2015-12-11 ENCOUNTER — Encounter: Payer: Self-pay | Admitting: Cardiology

## 2015-12-11 VITALS — BP 126/76 | HR 97 | Ht 69.0 in | Wt 184.0 lb

## 2015-12-11 DIAGNOSIS — N183 Chronic kidney disease, stage 3 (moderate): Secondary | ICD-10-CM

## 2015-12-11 DIAGNOSIS — R0602 Shortness of breath: Secondary | ICD-10-CM | POA: Diagnosis not present

## 2015-12-11 DIAGNOSIS — I2510 Atherosclerotic heart disease of native coronary artery without angina pectoris: Secondary | ICD-10-CM

## 2015-12-11 DIAGNOSIS — Z8679 Personal history of other diseases of the circulatory system: Secondary | ICD-10-CM | POA: Diagnosis not present

## 2015-12-11 NOTE — Progress Notes (Signed)
Cardiology Office Note  Date: 12/11/2015   ID: [name] Luis Sr., DOB [birth year], MRN [id number]  PCP: [name], MD  Primary Cardiologist: [name], MD   Chief Complaint  Patient presents with  . Coronary Artery Disease    History of Present Illness: [name] CHARNLEY Sr. is an 82 y.o. male last seen in April. He presents for a routine follow-up visit. No reported angina symptoms at this time. He has noticed more shortness of breath, attributes this to the high heat and humidity. Also with intermittent cough over the last few weeks, no obvious fevers or chills. 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  He remains on aspirin, otherwise no regular cardiac medications. He prefers to keep things as simple as possible. Oxygen saturation was mildly reduced today. He has not had a recent chest x-ray.  He does have a history of cardiomyopathy, although normalization of LVEF by last echocardiogram in 2013. His weight is stable, no orthopnea or PND.  Past Medical History:  Diagnosis Date  . CKD (chronic kidney disease) stage 3, GFR 30-59 ml/min   . Coronary atherosclerosis of native coronary artery    Status post CABG - LIMA to LAD, SVG to LCX and SVG to RCA  . GERD (gastroesophageal reflux disease)   . NSTEMI (non-ST elevated myocardial infarction) [facility]) August 2013  . Prostate cancer ([place])   . Secondary cardiomyopathy [facility]) August 2013   LVEF 30-35% at time of infarct, improved to 60-65% as of 03/2012     Past Surgical History:  Procedure Laterality Date  . CORONARY ARTERY BYPASS GRAFT  11/29/2011   Procedure: CORONARY ARTERY BYPASS GRAFTING (CABG);  Surgeon: Ivin Poot, MD;  Location: [place];  Service: Open Heart Surgery;  Laterality: N/A;  Coronary artery bypass graft time three using left internal mammary artery and right leg saphenous vein harvested endoscopically. Transesophageal echocardiogram performed intraoperatively.  Marland Kitchen HERNIA REPAIR   1970's  . LEFT HEART CATHETERIZATION WITH CORONARY ANGIOGRAM N/A 11/27/2011   Procedure: LEFT HEART CATHETERIZATION WITH CORONARY ANGIOGRAM;  Surgeon: Hillary Bow, MD;  Location: [facility] CATH LAB;  Service: Cardiovascular;  Laterality: N/A;    Current Outpatient Prescriptions  Medication Sig Dispense Refill  . aspirin 81 MG tablet Take 81 mg by mouth daily. Reported on 08/08/2015    . calcium-vitamin D (OSCAL WITH D) 500-200 MG-UNIT per tablet Take 1 tablet by mouth daily.    Marland Kitchen docusate sodium (COLACE) 100 MG capsule Take 100 mg by mouth daily as needed. For stool softner    . Multiple Vitamin (MULTIVITAMIN WITH MINERALS) TABS Take 1 tablet by mouth daily.     No current facility-administered medications for this visit.    Allergies:  Morphine and related and Penicillins   Social History: The patient  reports that he quit smoking about 26 years ago. His smoking use included Cigars and Cigarettes. He has a 80.00 pack-year smoking history. He has never used smokeless tobacco. He reports that he drinks about 0.6 oz of alcohol per week . He reports that he does not use drugs.   ROS:  Please see the history of present illness. Otherwise, complete review of systems is positive for decreased hearing.  All other systems are reviewed and negative.   Physical Exam: VS:  BP 126/76   Pulse 97   Ht 5\' 9"  (1.753 m)   Wt 184 lb (83.5 kg)   SpO2 (!) 89%   BMI  27.17 kg/m , BMI Body mass index is 27.17 kg/m.  Wt Readings from Last 3 Encounters:  12/11/15 184 lb (83.5 kg)  08/08/15 182 lb (82.6 kg)  12/11/14 182 lb (82.6 kg)    General: Elderly male, no distress. HEENT: Conjunctiva and lids normal, oropharynx clear. Neck: Supple, no elevated JVP or carotid bruits, no thyromegaly. Lungs: Scattered crackles at the base, right greater than left, nonlabored breathing at rest. Cardiac: Regular rate and rhythm, no S3 or significant systolic murmur, no pericardial rub. Abdomen: Soft, nontender, bowel  sounds present, no guarding or rebound. Extremities: No pitting edema, distal pulses 2+. Skin: Warm and dry. Musculoskeletal: No kyphosis. Neuropsychiatric: Alert and oriented 3, affect appropriate.  ECG: I personally reviewed the tracing from 08/08/2015 which showed normal sinus rhythm with left anterior fascicular block and nonspecific T-wave changes.  Other Studies Reviewed Today:  Echocardiogram 04/06/2012: Study Conclusions  - Left ventricle: The cavity size was normal. Wall thickness was normal. Systolic function was normal. The estimated ejection fraction was in the range of 60% to 65%. Hypokinesis of the basalinferoseptal myocardium. There was an increased relative contribution of atrial contraction to ventricular filling. Doppler parameters are consistent with abnormal left ventricular relaxation (grade 1 diastolic dysfunction). - Left atrium: The atrium was mildly dilated. - Atrial septum: No defect or patent foramen ovale was identified.  Assessment and Plan:  1. Recent increased dyspnea on exertion with intermittent coughing. Oxygen saturation mildly reduced today on room air. Some basilar crackles noted as well. We are sending him for PA and lateral chest x-ray to exclude developing pneumonia as a possibility. He does have a history of cardiomyopathy although normalization of LVEF by last assessment, has not had any recent PND or orthopnea, weight is stable. If chest x-ray unrevealing or nonspecific may need echocardiogram as well.  2. CAD status post CABG as outlined above. He has preferred conservative management over time and is on aspirin at this time alone for cardiac regimen.  3. CKD stage 3 by history. He follows with [name].  4. History of cardiomyopathy with improvement in LVEF to the range of 60-65% as of 2013.  Current medicines were reviewed with the patient today.   Orders Placed This Encounter  Procedures  . DG Chest 2 View     Disposition: Follow up on chest x-ray results.  Signed, Satira Sark, MD, [facility] 12/11/2015 2:17 PM    [place] at [place], [place], [place] [address] Phone: [phone]; Fax: [phone]

## 2015-12-11 NOTE — Patient Instructions (Signed)
Your physician wants you to follow-up in: [place] DR. Domenic Polite You will receive a reminder letter in the mail two months in advance. If you don't receive a letter, please call our office to schedule the follow-up appointment.  Your physician recommends that you continue on your current medications as directed. Please refer to the Current Medication list given to you today.  A chest x-ray takes a picture of the organs and structures inside the chest, including the heart, lungs, and blood vessels. This test can show several things, including, whether the heart is enlarges; whether fluid is building up in the lungs; and whether pacemaker / defibrillator leads are still in place.  Thank you for choosing [place]!!

## 2015-12-12 ENCOUNTER — Ambulatory Visit (HOSPITAL_COMMUNITY)
Admission: RE | Admit: 2015-12-12 | Discharge: 2015-12-12 | Disposition: A | Discharge status: Home / Self Care | Payer: Medicare Other | Source: Ambulatory Visit | Attending: Cardiology | Admitting: Cardiology

## 2015-12-12 ENCOUNTER — Telehealth: Payer: Self-pay | Admitting: *Deleted

## 2015-12-12 DIAGNOSIS — R0602 Shortness of breath: Secondary | ICD-10-CM | POA: Insufficient documentation

## 2015-12-12 DIAGNOSIS — R05 Cough: Secondary | ICD-10-CM | POA: Diagnosis not present

## 2015-12-12 DIAGNOSIS — I517 Cardiomegaly: Secondary | ICD-10-CM | POA: Insufficient documentation

## 2015-12-12 DIAGNOSIS — Z9889 Other specified postprocedural states: Secondary | ICD-10-CM | POA: Diagnosis not present

## 2015-12-12 DIAGNOSIS — Z951 Presence of aortocoronary bypass graft: Secondary | ICD-10-CM | POA: Diagnosis not present

## 2015-12-12 DIAGNOSIS — J8410 Pulmonary fibrosis, unspecified: Secondary | ICD-10-CM | POA: Diagnosis not present

## 2015-12-12 DIAGNOSIS — I700 Atherosclerosis of aorta: Secondary | ICD-10-CM | POA: Diagnosis not present

## 2015-12-12 NOTE — Telephone Encounter (Signed)
-----   Message from Satira Sark, MD sent at 12/12/2015  1:13 PM EDT ----- Results reviewed. Report indicates chronic pulmonary fibrotic changes, no heart failure or obvious infiltrates to suggest pneumonia. Please let him know and also contact Dr. Marcial Pacas office with a copy of this report. Given his recent hypoxia, would suggested that he see [name] to discuss chest CT for further evaluation and possibly ambulatory oxygen saturation measurements. A copy of this test should be forwarded to [name], MD.

## 2015-12-19 ENCOUNTER — Encounter: Payer: Self-pay | Admitting: Cardiology

## 2015-12-27 NOTE — Telephone Encounter (Signed)
Notes Recorded by Laurine Blazer, LPN on [id number] at [id number] AM EDT Patient notified and verbalized understanding. Copy to pmd. States he will call there office for appointment next week to follow up on this. ------

## 2016-01-01 DIAGNOSIS — J449 Chronic obstructive pulmonary disease, unspecified: Secondary | ICD-10-CM | POA: Diagnosis not present

## 2016-01-01 DIAGNOSIS — R0600 Dyspnea, unspecified: Secondary | ICD-10-CM | POA: Diagnosis not present

## 2016-01-01 DIAGNOSIS — R5383 Other fatigue: Secondary | ICD-10-CM | POA: Diagnosis not present

## 2016-01-01 DIAGNOSIS — J849 Interstitial pulmonary disease, unspecified: Secondary | ICD-10-CM | POA: Diagnosis not present

## 2016-01-01 DIAGNOSIS — Z79899 Other long term (current) drug therapy: Secondary | ICD-10-CM | POA: Diagnosis not present

## 2016-01-03 DIAGNOSIS — R0602 Shortness of breath: Secondary | ICD-10-CM | POA: Diagnosis not present

## 2016-01-03 DIAGNOSIS — R918 Other nonspecific abnormal finding of lung field: Secondary | ICD-10-CM | POA: Diagnosis not present

## 2016-01-14 DIAGNOSIS — Z1211 Encounter for screening for malignant neoplasm of colon: Secondary | ICD-10-CM | POA: Diagnosis not present

## 2016-01-14 DIAGNOSIS — Z1389 Encounter for screening for other disorder: Secondary | ICD-10-CM | POA: Diagnosis not present

## 2016-01-14 DIAGNOSIS — Z299 Encounter for prophylactic measures, unspecified: Secondary | ICD-10-CM | POA: Diagnosis not present

## 2016-01-14 DIAGNOSIS — Z7189 Other specified counseling: Secondary | ICD-10-CM | POA: Diagnosis not present

## 2016-01-14 DIAGNOSIS — Z0000 Encounter for general adult medical examination without abnormal findings: Secondary | ICD-10-CM | POA: Diagnosis not present

## 2016-01-15 DIAGNOSIS — Z79899 Other long term (current) drug therapy: Secondary | ICD-10-CM | POA: Diagnosis not present

## 2016-01-15 DIAGNOSIS — R5383 Other fatigue: Secondary | ICD-10-CM | POA: Diagnosis not present

## 2016-01-15 DIAGNOSIS — Z125 Encounter for screening for malignant neoplasm of prostate: Secondary | ICD-10-CM | POA: Diagnosis not present

## 2016-01-30 DIAGNOSIS — Z79899 Other long term (current) drug therapy: Secondary | ICD-10-CM | POA: Diagnosis not present

## 2016-01-31 NOTE — Progress Notes (Signed)
Cardiology Office Note  Date: 02/03/2016   ID: [name] Luis Sr., DOB [birth year], MRN [id number]  PCP: [name] Chroman, MD  Primary Cardiologist: [name] Lesches, MD   Chief Complaint  Patient presents with  . Coronary Artery Disease    History of Present Illness: [name] YATSKO Sr. is an 82 y.o. male last seen in August. He presents for a follow-up visit. He has seen Dr. Woody [name] in the interim, underwent a chest x-ray ultimately chest CT. Chronic fibrotic changes were noted, but he did not have any obvious pulmonary edema or infiltrates. Recent lab work reviewed below as well. He has also been hypoxic on room air and has been set up by Dr. Woody [name] for oxygen which he mainly uses at nighttime. He does state he feels better.  He is not reporting any angina symptoms, there have been no significant medication changes otherwise.  Today we talked about getting a follow-up echocardiogram to reassess LVEF, it was last documented in normal range as of 2013.  Past Medical History:  Diagnosis Date  . CKD (chronic kidney disease) stage 3, GFR 30-59 ml/min   . Coronary atherosclerosis of native coronary artery    Status post CABG - LIMA to LAD, SVG to LCX and SVG to RCA  . GERD (gastroesophageal reflux disease)   . NSTEMI (non-ST elevated myocardial infarction) [facility]) August 2013  . Prostate cancer ([name])   . Secondary cardiomyopathy [facility]) August 2013   LVEF 30-35% at time of infarct, improved to 60-65% as of 03/2012     Past Surgical History:  Procedure Laterality Date  . CORONARY ARTERY BYPASS GRAFT  11/29/2011   Procedure: CORONARY ARTERY BYPASS GRAFTING (CABG);  Surgeon: [name] Poot, MD;  Location: [place];  Service: Open Heart Surgery;  Laterality: N/A;  Coronary artery bypass graft time three using left internal mammary artery and right leg saphenous vein harvested endoscopically. Transesophageal echocardiogram performed intraoperatively.  [name] [name] HERNIA REPAIR  1970's  . LEFT HEART CATHETERIZATION  WITH CORONARY ANGIOGRAM N/A 11/27/2011   Procedure: LEFT HEART CATHETERIZATION WITH CORONARY ANGIOGRAM;  Surgeon: [name] Bow, MD;  Location: [facility] CATH LAB;  Service: Cardiovascular;  Laterality: N/A;    Current Outpatient Prescriptions  Medication Sig Dispense Refill  . aspirin 81 MG tablet Take 81 mg by mouth daily. Reported on 08/08/2015    . calcium-vitamin D (OSCAL WITH D) 500-200 MG-UNIT per tablet Take 1 tablet by mouth daily.    [name] [name] docusate sodium (COLACE) 100 MG capsule Take 100 mg by mouth daily as needed. For stool softner    . GuaiFENesin (MUCINEX PO) Take by mouth.    . Multiple Vitamin (MULTIVITAMIN WITH MINERALS) TABS Take 1 tablet by mouth daily.     No current facility-administered medications for this visit.    Allergies:  Morphine and related and Penicillins   Social History: The patient  reports that he quit smoking about 26 years ago. His smoking use included Cigars and Cigarettes. He has a 80.00 pack-year smoking history. He has never used smokeless tobacco. He reports that he drinks about 0.6 oz of alcohol per week . He reports that he does not use drugs.   ROS:  Please see the history of present illness. Otherwise, complete review of systems is positive for chronic dyspnea exertion.  All other systems are reviewed and negative.   Physical Exam: VS:  BP 130/80   Pulse 83   Ht 5\' 9"  (1.753 m)   Wt 187 lb (84.8  kg)   SpO2 (!) 89%   BMI 27.62 kg/m , BMI Body mass index is 27.62 kg/m.  Wt Readings from Last 3 Encounters:  02/03/16 187 lb (84.8 kg)  12/11/15 184 lb (83.5 kg)  08/08/15 182 lb (82.6 kg)    General: Elderly male, no distress. HEENT: Conjunctiva and lids normal, oropharynx clear. Neck: Supple, no elevated JVP or carotid bruits, no thyromegaly. Lungs: Faint crackles at the bases, nonlabored breathing at rest. Cardiac: Regular rate and rhythm, no S3 or significant systolic murmur, no pericardial rub. Abdomen: Soft, nontender, bowel sounds present,  no guarding or rebound. Extremities: No pitting edema, distal pulses 2+.  ECG: I personally reviewed the tracing from 08/08/2015 which showed sinus rhythm with left anterior fascicular block and nonspecific T-wave abnormalities.  Recent Labwork:  September 2017: BUN 39, creatinine 1.8, potassium 5.7, TSH 1.98, AST 19, ALT 17, NT-proBNP 7598, d-dimer 0.35, hemoglobin 14.5  Other Studies Reviewed Today:  Echocardiogram 04/06/2012: Study Conclusions  - Left ventricle: The cavity size was normal. Wall thickness was normal. Systolic function was normal. The estimated ejection fraction was in the range of 60% to 65%. Hypokinesis of the basalinferoseptal myocardium. There was an increased relative contribution of atrial contraction to ventricular filling. Doppler parameters are consistent with abnormal left ventricular relaxation (grade 1 diastolic dysfunction). - Left atrium: The atrium was mildly dilated. - Atrial septum: No defect or patent foramen ovale was identified.  Chest x-ray 12/12/2015: FINDINGS: The lungs are adequately inflated. The interstitial markings are chronically increased. The heart is top-normal in size. There are post CABG changes. There is calcification in the wall of the aortic arch. There is no pleural effusion. There is stable wedge compression of the body of T12.  IMPRESSION: Chronic pulmonary fibrotic changes. Cardiomegaly without evidence of pulmonary edema. Post CABG changes.  If the patient's cough persists, chest CT scanning would be a useful next imaging step.  Aortic atherosclerosis.  Chest CT 01/03/2016 [facility]): Chronic fibrotic lung changes bilaterally with evidence of previous granulomatous disease. No focal infiltrates or mass noted.  Assessment and Plan:  1. Dyspnea exertion and documented chronic fibrotic lung changes by recent chest CT at [facility]. Now on oxygen for hypoxia per Dr. Woody [name] and states that he does feel  better.  2. CAD status post CABG in 2013. I would like to make sure that LVEF is still normal range since he is essentially on aspirin only for medical therapy. If he does have an associated cardiomyopathy, we can talk about medical therapy additions that may be of benefit.  Current medicines were reviewed with the patient today.   Orders Placed This Encounter  Procedures  . ECHOCARDIOGRAM COMPLETE    Disposition: Tentatively plan follow-up with me in 6 months if LVEF remains normal.  Signed, Satira Sark, MD, [facility] 02/03/2016 9:13 AM    [place] at [place], [place], [place] [address] Phone: [phone]; Fax: [phone]

## 2016-02-03 ENCOUNTER — Encounter: Payer: Self-pay | Admitting: Cardiology

## 2016-02-03 ENCOUNTER — Ambulatory Visit (INDEPENDENT_AMBULATORY_CARE_PROVIDER_SITE_OTHER): Payer: Medicare Other | Admitting: Cardiology

## 2016-02-03 VITALS — BP 130/80 | HR 83 | Ht 69.0 in | Wt 187.0 lb

## 2016-02-03 DIAGNOSIS — R0602 Shortness of breath: Secondary | ICD-10-CM | POA: Diagnosis not present

## 2016-02-03 DIAGNOSIS — I2510 Atherosclerotic heart disease of native coronary artery without angina pectoris: Secondary | ICD-10-CM

## 2016-02-03 DIAGNOSIS — E875 Hyperkalemia: Secondary | ICD-10-CM | POA: Diagnosis not present

## 2016-02-03 DIAGNOSIS — Z8679 Personal history of other diseases of the circulatory system: Secondary | ICD-10-CM | POA: Diagnosis not present

## 2016-02-03 NOTE — Patient Instructions (Signed)

## 2016-02-11 ENCOUNTER — Ambulatory Visit (INDEPENDENT_AMBULATORY_CARE_PROVIDER_SITE_OTHER): Payer: Medicare Other

## 2016-02-11 ENCOUNTER — Other Ambulatory Visit: Payer: Self-pay

## 2016-02-11 DIAGNOSIS — R0602 Shortness of breath: Secondary | ICD-10-CM | POA: Diagnosis not present

## 2016-02-11 DIAGNOSIS — I2510 Atherosclerotic heart disease of native coronary artery without angina pectoris: Secondary | ICD-10-CM | POA: Diagnosis not present

## 2016-02-11 LAB — ECHOCARDIOGRAM COMPLETE
Ao-asc: 36 cm
E decel time: 103 msec
E/e' ratio: 4.33
FS: 35 % (ref 28–44)
IV/PV OW: 0.97
LA ID, A-P, ES: 31 mm
LA diam end sys: 31 mm
LA vol index: 19.5 mL/m2
LADIAMINDEX: 1.54 cm/m2
LAVOL: 39.1 mL
LAVOLA4C: 36 mL
LV E/e' medial: 4.33
LV E/e'average: 4.33
LV SIMPSON'S DISK: 60
LV TDI E'LATERAL: 9.86
LV TDI E'MEDIAL: 5.13
LV dias vol index: 29 mL/m2
LV dias vol: 58 mL — AB (ref 62–150)
LV sys vol index: 12 mL/m2
LVELAT: 9.86 cm/s
LVOT SV: 56 mL
LVOT VTI: 16.1 cm
LVOT area: 3.46 cm2
LVOT diameter: 21 mm
LVOT peak vel: 73.9 cm/s
LVSYSVOL: 23 mL (ref 21–61)
MV Dec: 103
MV pk E vel: 42.7 m/s
MVPKAVEL: 92.6 m/s
PV Reg grad dias: 17 mmHg
PV Reg vel dias: 207 cm/s
PW: 7.72 mm — AB (ref 0.6–1.1)
RV TAPSE: 13.1 mm
RV sys press: 54 mmHg
Reg peak vel: 356 cm/s
Stroke v: 35 ml
TRMAXVEL: 356 cm/s

## 2016-02-12 ENCOUNTER — Telehealth: Payer: Self-pay | Admitting: *Deleted

## 2016-02-12 NOTE — Telephone Encounter (Signed)
-----   Message from Massie Maroon, [place] sent at 02/12/2016  9:26 AM EDT -----   ----- Message ----- From: Satira Sark, MD Sent: 02/11/2016   4:33 PM To: Merlene Laughter, LPN  Results reviewed. LVEF remains normal range of 55-60%. Mild mitral regurgitation is unlikely to be symptom provoking. He does have pulmonary hypertension which could be contributing to his shortness of breath and possibly related to chronic lung disease. The recent addition of oxygen should be somewhat helpful. A copy of this test should be forwarded to [name], MD.

## 2016-02-12 NOTE — Telephone Encounter (Signed)
Pt aware - routed to pcp  

## 2016-02-19 ENCOUNTER — Encounter (HOSPITAL_COMMUNITY): Payer: Self-pay | Admitting: Hematology

## 2016-02-19 ENCOUNTER — Encounter (HOSPITAL_COMMUNITY): Payer: Medicare Other | Attending: Hematology | Admitting: Hematology

## 2016-02-19 ENCOUNTER — Encounter (HOSPITAL_COMMUNITY): Payer: Medicare Other

## 2016-02-19 VITALS — BP 132/76 | HR 104 | Temp 98.7°F | Resp 22 | Ht 69.0 in | Wt 174.7 lb

## 2016-02-19 DIAGNOSIS — C61 Malignant neoplasm of prostate: Secondary | ICD-10-CM | POA: Insufficient documentation

## 2016-02-19 DIAGNOSIS — M810 Age-related osteoporosis without current pathological fracture: Secondary | ICD-10-CM

## 2016-02-19 DIAGNOSIS — Z87891 Personal history of nicotine dependence: Secondary | ICD-10-CM | POA: Insufficient documentation

## 2016-02-19 DIAGNOSIS — I2510 Atherosclerotic heart disease of native coronary artery without angina pectoris: Secondary | ICD-10-CM | POA: Diagnosis not present

## 2016-02-19 DIAGNOSIS — I252 Old myocardial infarction: Secondary | ICD-10-CM | POA: Diagnosis not present

## 2016-02-19 DIAGNOSIS — I255 Ischemic cardiomyopathy: Secondary | ICD-10-CM | POA: Insufficient documentation

## 2016-02-19 DIAGNOSIS — Z79899 Other long term (current) drug therapy: Secondary | ICD-10-CM | POA: Diagnosis not present

## 2016-02-19 DIAGNOSIS — M8580 Other specified disorders of bone density and structure, unspecified site: Secondary | ICD-10-CM

## 2016-02-19 DIAGNOSIS — Z951 Presence of aortocoronary bypass graft: Secondary | ICD-10-CM | POA: Diagnosis not present

## 2016-02-19 DIAGNOSIS — Z7982 Long term (current) use of aspirin: Secondary | ICD-10-CM | POA: Diagnosis not present

## 2016-02-19 DIAGNOSIS — E785 Hyperlipidemia, unspecified: Secondary | ICD-10-CM | POA: Insufficient documentation

## 2016-02-19 DIAGNOSIS — N183 Chronic kidney disease, stage 3 (moderate): Secondary | ICD-10-CM | POA: Diagnosis not present

## 2016-02-19 DIAGNOSIS — K219 Gastro-esophageal reflux disease without esophagitis: Secondary | ICD-10-CM | POA: Diagnosis not present

## 2016-02-19 DIAGNOSIS — I129 Hypertensive chronic kidney disease with stage 1 through stage 4 chronic kidney disease, or unspecified chronic kidney disease: Secondary | ICD-10-CM | POA: Insufficient documentation

## 2016-02-19 LAB — CBC WITH DIFFERENTIAL/PLATELET
BASOS ABS: 0.1 10*3/uL (ref 0.0–0.1)
BASOS PCT: 1 %
EOS PCT: 2 %
Eosinophils Absolute: 0.2 10*3/uL (ref 0.0–0.7)
HEMATOCRIT: 43.5 % (ref 39.0–52.0)
Hemoglobin: 14.4 g/dL (ref 13.0–17.0)
Lymphocytes Relative: 17 %
Lymphs Abs: 1.7 10*3/uL (ref 0.7–4.0)
MCH: 30.9 pg (ref 26.0–34.0)
MCHC: 33.1 g/dL (ref 30.0–36.0)
MCV: 93.3 fL (ref 78.0–100.0)
MONO ABS: 0.8 10*3/uL (ref 0.1–1.0)
MONOS PCT: 8 %
Neutro Abs: 7.1 10*3/uL (ref 1.7–7.7)
Neutrophils Relative %: 72 %
PLATELETS: 256 10*3/uL (ref 150–400)
RBC: 4.66 MIL/uL (ref 4.22–5.81)
RDW: 14.2 % (ref 11.5–15.5)
WBC: 9.8 10*3/uL (ref 4.0–10.5)

## 2016-02-19 LAB — COMPREHENSIVE METABOLIC PANEL
ALBUMIN: 4.2 g/dL (ref 3.5–5.0)
ALT: 22 U/L (ref 17–63)
ANION GAP: 6 (ref 5–15)
AST: 24 U/L (ref 15–41)
Alkaline Phosphatase: 38 U/L (ref 38–126)
BILIRUBIN TOTAL: 0.6 mg/dL (ref 0.3–1.2)
BUN: 44 mg/dL — AB (ref 6–20)
CHLORIDE: 107 mmol/L (ref 101–111)
CO2: 25 mmol/L (ref 22–32)
Calcium: 8.9 mg/dL (ref 8.9–10.3)
Creatinine, Ser: 1.44 mg/dL — ABNORMAL HIGH (ref 0.61–1.24)
GFR calc Af Amer: 49 mL/min — ABNORMAL LOW (ref >60)
GFR, EST NON AFRICAN AMERICAN: 42 mL/min — AB (ref >60)
GLUCOSE: 122 mg/dL — AB (ref 65–99)
POTASSIUM: 5.1 mmol/L (ref 3.5–5.1)
Sodium: 138 mmol/L (ref 135–145)
TOTAL PROTEIN: 7.4 g/dL (ref 6.5–8.1)

## 2016-02-19 LAB — PSA: PSA: 4.15 ng/mL — AB (ref 0.00–4.00)

## 2016-02-19 NOTE — Patient Instructions (Signed)
[place] at [facility]  Discharge Instructions:  Labs today   Follow up in 4 months with labs/bone density scan  _______________________________________________________________  Thank you for choosing [place] at [facility] to provide your oncology and hematology care.  To afford each patient quality time with our providers, please arrive at least 15 minutes before your scheduled appointment.  You need to re-schedule your appointment if you arrive 10 or more minutes late.  We strive to give you quality time with our providers, and arriving late affects you and other patients whose appointments are after yours.  Also, if you no show three or more times for appointments you may be dismissed from the clinic.  Again, thank you for choosing [place] at [place] hope is that these requests will allow you access to exceptional care and in a timely manner. _______________________________________________________________  If you have questions after your visit, please contact our office at [phone] between the hours of 8:30 a.m. and 5:00 p.m. Voicemails left after 4:30 p.m. will not be returned until the following business day. _______________________________________________________________  For prescription refill requests, have your pharmacy contact our office. _______________________________________________________________  Recommendations made by the consultant and any test results will be sent to your referring physician. _______________________________________________________________

## 2016-02-19 NOTE — Progress Notes (Signed)
Marland Kitchen    HEMATOLOGY/ONCOLOGY CONSULTATION NOTE  Date of Service: 02/19/2016  Patient Care Team: [name], MD as PCP - General (Internal Medicine)  CHIEF COMPLAINTS/PURPOSE OF CONSULTATION:  Prostate cancer  HISTORY OF PRESENTING ILLNESS:   [name] Luis Sr. is a wonderful 82 y.o. male who has been referred to [place] by Dr .[name], MD  for evaluation and continued management of prostate cancer.  Patient has a history of hypertension, GERD, non-STEMI, ischemic cardiomyopathy status post CABG in August 2013, chronic indices stage III.  Patient was following up with Dr. Seleta Rhymes (medical oncology) and Dr Braulio Bosch (urology) for management of his prostate cancer. He was apparently noted to have a PSA of 94.3 in 2008 and on bone scan was thought to have stage IV disease metastatic to L2-L3 - subsequently however these findings were thought to possibly represent degenerative changes. He is not sure if he had a biopsy to get a tissue diagnosis. Doesn't remember that he got a biopsy of his prostate or what his Gleason score was. He reports that he had bilateral orchiectomy by Dr. Braulio Bosch on 09/27/2006. His PSA levels apparently dropped down to 0 after his surgery and state they're until about 2012 after which they have been noted to be gradually increasing progressively and were noted to be 1.5 in May 2016 then 2.2 on 03/20/2015 and 2.9 on 09/27/2015. Patient reports no new urinary symptoms or hematuria. No new focal bone pains.  He has been found to have osteopenia previously on bone density scans in 2012 and has been on calcium and vitamin D supplementation. He was scheduled to get a possible repeat DEXA scan in October 2017. He is still fairly functionally active.  Notes that his weight has been fairly stable.  MEDICAL HISTORY:  Past Medical History:  Diagnosis Date  . CKD (chronic kidney disease) stage 3, GFR 30-59 ml/min   . Coronary atherosclerosis of native coronary artery    Status post CABG - LIMA to LAD, SVG to LCX and SVG to RCA  . GERD (gastroesophageal reflux disease)   . NSTEMI (non-ST elevated myocardial infarction) [facility]) August 2013  . Prostate cancer ([place])   . Secondary cardiomyopathy ([place]) August 2013   LVEF 30-35% at time of infarct, improved to 60-65% as of 03/2012     SURGICAL HISTORY: Past Surgical History:  Procedure Laterality Date  . CORONARY ARTERY BYPASS GRAFT  11/29/2011   Procedure: CORONARY ARTERY BYPASS GRAFTING (CABG);  Surgeon: Ivin Poot, MD;  Location: [facility];  Service: Open Heart Surgery;  Laterality: N/A;  Coronary artery bypass graft time three using left internal mammary artery and right leg saphenous vein harvested endoscopically. Transesophageal echocardiogram performed intraoperatively.  Marland Kitchen HERNIA REPAIR  1970's  . LEFT HEART CATHETERIZATION WITH CORONARY ANGIOGRAM N/A 11/27/2011   Procedure: LEFT HEART CATHETERIZATION WITH CORONARY ANGIOGRAM;  Surgeon: Hillary Bow, MD;  Location: [facility] CATH LAB;  Service: Cardiovascular;  Laterality: N/A;    SOCIAL HISTORY: Social History   Social History  . Marital status: Divorced    Spouse name: N/A  . Number of children: N/A  . Years of education: N/A   Occupational History  . Not on file.   Social History Main Topics  . Smoking status: Former Smoker    Packs/day: 2.00    Years: 40.00    Types: Cigars, Cigarettes    Quit date: 02/25/1989  . Smokeless tobacco: Never Used  . Alcohol use 0.6 oz/week    1 Shots  of liquor per week  . Drug use: No  . Sexual activity: No   Other Topics Concern  . Not on file   Social History Narrative  . No narrative on file    FAMILY HISTORY: History reviewed. No pertinent family history.  ALLERGIES:  is allergic to morphine and related and penicillins.  MEDICATIONS:  Current Outpatient Prescriptions  Medication Sig Dispense Refill  . aspirin 81 MG tablet Take 81 mg by mouth daily. Reported on 08/08/2015    . calcium-vitamin D  (OSCAL WITH D) 500-200 MG-UNIT per tablet Take 1 tablet by mouth daily.    Marland Kitchen docusate sodium (COLACE) 100 MG capsule Take 100 mg by mouth daily as needed. For stool softner    . GuaiFENesin (MUCINEX PO) Take by mouth.    . Multiple Vitamin (MULTIVITAMIN WITH MINERALS) TABS Take 1 tablet by mouth daily.     No current facility-administered medications for this visit.     REVIEW OF SYSTEMS:    10 Point review of Systems was done is negative except as noted above.  PHYSICAL EXAMINATION: ECOG PERFORMANCE STATUS: 2 - Symptomatic, <50% confined to bed  . Vitals:   02/19/16 1302  BP: 132/76  Pulse: (!) 104  Resp: (!) 22  Temp: 98.7 F (37.1 C)   Filed Weights   02/19/16 1302  Weight: 174 lb 11.2 oz (79.2 kg)   .Body mass index is 25.8 kg/m.  GENERAL:alert, in no acute distress and comfortable SKIN: skin color, texture, turgor are normal, no rashes or significant lesions EYES: normal, conjunctiva are pink and non-injected, sclera clear OROPHARYNX:no exudate, no erythema and lips, buccal mucosa, and tongue normal  NECK: supple, no JVD, thyroid normal size, non-tender, without nodularity LYMPH:  no palpable lymphadenopathy in the cervical, axillary or inguinal LUNGS: clear to auscultation with normal respiratory effort HEART: regular rate & rhythm,  no murmurs and no lower extremity edema ABDOMEN: abdomen soft, non-tender, normoactive bowel sounds  Musculoskeletal: no cyanosis of digits and no clubbing  PSYCH: alert & oriented x 3 with fluent speech NEURO: no focal motor/sensory deficits  LABORATORY DATA:  I have reviewed the data as listed  . CBC Latest Ref Rng & Units 02/19/2016 01/08/2012 12/18/2011  WBC 4.0 - 10.5 K/uL 9.8 8.6 9.5  Hemoglobin 13.0 - 17.0 g/dL 14.4 11.1(L) 10.1(L)  Hematocrit 39.0 - 52.0 % 43.5 33.9(L) 31.3(L)  Platelets 150 - 400 K/uL 256 345.0 502.0(H)    . CMP Latest Ref Rng & Units 02/19/2016 02/02/2012 02/01/2012  Glucose 65 - 99 mg/dL 122(H) 87 97    BUN 6 - 20 mg/dL 44(H) 32(H) 31(H)  Creatinine 0.61 - 1.24 mg/dL 1.44(H) 1.5 1.6(H)  Sodium 135 - 145 mmol/L 138 138 138  Potassium 3.5 - 5.1 mmol/L 5.1 5.2(H) 5.9(H)  Chloride 101 - 111 mmol/L 107 103 102  CO2 22 - 32 mmol/L 25 28 29   Calcium 8.9 - 10.3 mg/dL 8.9 9.1 9.7  Total Protein 6.5 - 8.1 g/dL 7.4 - 8.1  Total Bilirubin 0.3 - 1.2 mg/dL 0.6 - 0.6  Alkaline Phos 38 - 126 U/L 38 - 46  AST 15 - 41 U/L 24 - 22  ALT 17 - 63 U/L 22 - 17   Component     Latest Ref Rng & Units 02/19/2016  PSA     0.00 - 4.00 ng/mL 4.15 (H)  Vitamin D, 25-Hydroxy     30.0 - 100.0 ng/mL 25.6 (L)  Testosterone     264 - 916  ng/dL <3 (L)     RADIOGRAPHIC STUDIES: I have personally reviewed the radiological images as listed and agreed with the findings in the report. No results found.  ASSESSMENT & PLAN:   82 year old Caucasian male with multiple medical comorbidities with  1) history of prostate cancer diagnosed in 2008. ?metastatic to L2-L3 (thought to be metastatic on bone scan but could not rule out presence of degenerative change ) His pretreatment PSA of the time was apparently 94.3 based on outside records . He had bilateral orchiectomy by Dr. Braulio Bosch in June 2008 . His PSA levels are apparently dropped to 0 and have been gradually increasing from 2012 . His PSA levels today are again increased to 4.15 up from 2.9 in June 2017 up from 2. 02/27/2015 up from 1.6 in May 2016 . Patient's PSA doubling time appears to be longer than 6 months . He currently has no acute new focal symptoms .no new bone pains .no hematuria or other urinary symptoms at this time . His testosterone levels appear to be well suppressed . Plan  -With multiple medical comorbidities I would be hesitant to treat him aggressively unless determined to be needed from a significant progression standpoint . -His PSA levels are still relatively controlled and are moving slowly with PSA doubling time of more than 6 months  . -No clear indication for acute change in management at this time . -We discussed that as the PSA levels exceeded 5 that is higher chance of picking up any measurable disease on imaging studies . -In the absence of measurable disease on imaging studies there is about 1 in 3 chance of response to empiric radiation of the prostate and prostatic bed though given his age and medical comorbidities who might not push for this approach. -We discussed in all reality that it is possible that one of his other significant medical problem might be a limiting factor before the prostate cancer . -Given his osteopenia and hypogonadism would recommend obtaining calcium and vitamin D in keeping physically active . -He is due for repeat bone density scan which we shall order for him to have prior to his follow-up visit with Dr. Whitney Muse .  #2. Patient Active Problem List   Diagnosis Date Noted  . Essential hypertension 06/13/2012  . Hyperlipidemia 02/03/2012  . CKD (chronic kidney disease), stage III 01/08/2012  . Coronary atherosclerosis of native coronary artery 11/28/2011  -Continue follow-up with primary care physician for management of other medical comorbidities  Plan was discussed in detail with the patient and he is agreeable to this.  Return to care with Dr. Whitney Muse in 3-4 months with repeat CBC, CMP, PSA and bone density test .  All of the patients questions were answered to his apparent satisfaction. The patient knows to call the clinic with any problems, questions or concerns.  I spent 50 minutes counseling the patient face to face. The total time spent in the appointment was 65 minutes and more than 50% was on counseling and direct patient cares.    Sullivan Lone MD [place] AAHIVMS [facility] Hematology/Oncology Physician [facility]  (Office):       [phone] (Work cell):  [phone] (Fax):           [phone]  02/19/2016 1:12 PM

## 2016-02-20 LAB — VITAMIN D 25 HYDROXY (VIT D DEFICIENCY, FRACTURES): Vit D, 25-Hydroxy: 25.6 ng/mL — ABNORMAL LOW (ref 30.0–100.0)

## 2016-02-20 LAB — TESTOSTERONE

## 2016-03-13 DIAGNOSIS — J849 Interstitial pulmonary disease, unspecified: Secondary | ICD-10-CM | POA: Diagnosis not present

## 2016-03-13 DIAGNOSIS — J449 Chronic obstructive pulmonary disease, unspecified: Secondary | ICD-10-CM | POA: Diagnosis not present

## 2016-03-13 DIAGNOSIS — N400 Enlarged prostate without lower urinary tract symptoms: Secondary | ICD-10-CM | POA: Diagnosis not present

## 2016-03-13 DIAGNOSIS — Z299 Encounter for prophylactic measures, unspecified: Secondary | ICD-10-CM | POA: Diagnosis not present

## 2016-03-13 DIAGNOSIS — I10 Essential (primary) hypertension: Secondary | ICD-10-CM | POA: Diagnosis not present

## 2016-04-16 DIAGNOSIS — L570 Actinic keratosis: Secondary | ICD-10-CM | POA: Diagnosis not present

## 2016-04-16 DIAGNOSIS — Z85828 Personal history of other malignant neoplasm of skin: Secondary | ICD-10-CM | POA: Diagnosis not present

## 2016-04-16 DIAGNOSIS — L814 Other melanin hyperpigmentation: Secondary | ICD-10-CM | POA: Diagnosis not present

## 2016-04-16 DIAGNOSIS — L821 Other seborrheic keratosis: Secondary | ICD-10-CM | POA: Diagnosis not present

## 2016-04-16 DIAGNOSIS — D1801 Hemangioma of skin and subcutaneous tissue: Secondary | ICD-10-CM | POA: Diagnosis not present

## 2016-06-15 ENCOUNTER — Ambulatory Visit (HOSPITAL_COMMUNITY)
Admission: RE | Admit: 2016-06-15 | Discharge: 2016-06-15 | Disposition: A | Discharge status: Home / Self Care | Payer: Medicare Other | Source: Ambulatory Visit | Attending: Hematology | Admitting: Hematology

## 2016-06-15 DIAGNOSIS — M810 Age-related osteoporosis without current pathological fracture: Secondary | ICD-10-CM

## 2016-06-15 DIAGNOSIS — M85832 Other specified disorders of bone density and structure, left forearm: Secondary | ICD-10-CM | POA: Insufficient documentation

## 2016-06-15 DIAGNOSIS — C61 Malignant neoplasm of prostate: Secondary | ICD-10-CM | POA: Insufficient documentation

## 2016-06-15 DIAGNOSIS — M85852 Other specified disorders of bone density and structure, left thigh: Secondary | ICD-10-CM | POA: Diagnosis not present

## 2016-06-22 ENCOUNTER — Encounter (HOSPITAL_COMMUNITY): Payer: Medicare Other

## 2016-06-22 ENCOUNTER — Encounter (HOSPITAL_COMMUNITY): Payer: Self-pay

## 2016-06-22 ENCOUNTER — Encounter (HOSPITAL_COMMUNITY): Payer: Medicare Other | Attending: Oncology | Admitting: Oncology

## 2016-06-22 VITALS — BP 130/78 | HR 80 | Temp 98.4°F | Resp 18 | Wt 176.5 lb

## 2016-06-22 DIAGNOSIS — C61 Malignant neoplasm of prostate: Secondary | ICD-10-CM | POA: Diagnosis not present

## 2016-06-22 DIAGNOSIS — Z951 Presence of aortocoronary bypass graft: Secondary | ICD-10-CM | POA: Insufficient documentation

## 2016-06-22 DIAGNOSIS — Z79899 Other long term (current) drug therapy: Secondary | ICD-10-CM | POA: Insufficient documentation

## 2016-06-22 DIAGNOSIS — I10 Essential (primary) hypertension: Secondary | ICD-10-CM

## 2016-06-22 DIAGNOSIS — E785 Hyperlipidemia, unspecified: Secondary | ICD-10-CM | POA: Diagnosis not present

## 2016-06-22 DIAGNOSIS — I2510 Atherosclerotic heart disease of native coronary artery without angina pectoris: Secondary | ICD-10-CM | POA: Insufficient documentation

## 2016-06-22 DIAGNOSIS — Z880 Allergy status to penicillin: Secondary | ICD-10-CM | POA: Insufficient documentation

## 2016-06-22 DIAGNOSIS — I252 Old myocardial infarction: Secondary | ICD-10-CM | POA: Diagnosis not present

## 2016-06-22 DIAGNOSIS — R0602 Shortness of breath: Secondary | ICD-10-CM | POA: Diagnosis not present

## 2016-06-22 DIAGNOSIS — Z885 Allergy status to narcotic agent status: Secondary | ICD-10-CM | POA: Diagnosis not present

## 2016-06-22 DIAGNOSIS — M8580 Other specified disorders of bone density and structure, unspecified site: Secondary | ICD-10-CM | POA: Diagnosis not present

## 2016-06-22 DIAGNOSIS — Z7982 Long term (current) use of aspirin: Secondary | ICD-10-CM | POA: Diagnosis not present

## 2016-06-22 DIAGNOSIS — Z87891 Personal history of nicotine dependence: Secondary | ICD-10-CM | POA: Diagnosis not present

## 2016-06-22 DIAGNOSIS — N183 Chronic kidney disease, stage 3 (moderate): Secondary | ICD-10-CM

## 2016-06-22 DIAGNOSIS — I129 Hypertensive chronic kidney disease with stage 1 through stage 4 chronic kidney disease, or unspecified chronic kidney disease: Secondary | ICD-10-CM | POA: Insufficient documentation

## 2016-06-22 DIAGNOSIS — I255 Ischemic cardiomyopathy: Secondary | ICD-10-CM | POA: Insufficient documentation

## 2016-06-22 DIAGNOSIS — K219 Gastro-esophageal reflux disease without esophagitis: Secondary | ICD-10-CM | POA: Diagnosis not present

## 2016-06-22 LAB — CBC WITH DIFFERENTIAL/PLATELET
BASOS ABS: 0 10*3/uL (ref 0.0–0.1)
Basophils Relative: 1 %
EOS PCT: 3 %
Eosinophils Absolute: 0.2 10*3/uL (ref 0.0–0.7)
HCT: 42.5 % (ref 39.0–52.0)
Hemoglobin: 13.8 g/dL (ref 13.0–17.0)
LYMPHS PCT: 21 %
Lymphs Abs: 1.7 10*3/uL (ref 0.7–4.0)
MCH: 30.9 pg (ref 26.0–34.0)
MCHC: 32.5 g/dL (ref 30.0–36.0)
MCV: 95.3 fL (ref 78.0–100.0)
Monocytes Absolute: 0.6 10*3/uL (ref 0.1–1.0)
Monocytes Relative: 7 %
NEUTROS ABS: 5.6 10*3/uL (ref 1.7–7.7)
NEUTROS PCT: 68 %
PLATELETS: 211 10*3/uL (ref 150–400)
RBC: 4.46 MIL/uL (ref 4.22–5.81)
RDW: 13.7 % (ref 11.5–15.5)
WBC: 8.1 10*3/uL (ref 4.0–10.5)

## 2016-06-22 LAB — COMPREHENSIVE METABOLIC PANEL
ALT: 23 U/L (ref 17–63)
AST: 25 U/L (ref 15–41)
Albumin: 4.1 g/dL (ref 3.5–5.0)
Alkaline Phosphatase: 39 U/L (ref 38–126)
Anion gap: 6 (ref 5–15)
BUN: 35 mg/dL — AB (ref 6–20)
CHLORIDE: 105 mmol/L (ref 101–111)
CO2: 26 mmol/L (ref 22–32)
CREATININE: 1.55 mg/dL — AB (ref 0.61–1.24)
Calcium: 9 mg/dL (ref 8.9–10.3)
GFR calc Af Amer: 45 mL/min — ABNORMAL LOW (ref >60)
GFR, EST NON AFRICAN AMERICAN: 39 mL/min — AB (ref >60)
Glucose, Bld: 100 mg/dL — ABNORMAL HIGH (ref 65–99)
Potassium: 5.3 mmol/L — ABNORMAL HIGH (ref 3.5–5.1)
Sodium: 137 mmol/L (ref 135–145)
Total Bilirubin: 0.9 mg/dL (ref 0.3–1.2)
Total Protein: 7 g/dL (ref 6.5–8.1)

## 2016-06-22 LAB — PSA: PSA: 4.95 ng/mL — AB (ref 0.00–4.00)

## 2016-06-22 NOTE — Progress Notes (Signed)
HEMATOLOGY/ONCOLOGY FOLLOW UP NOTE  Date of Service: 06/22/2016  Patient Care Team: [name], MD as PCP - General (Internal Medicine)  CHIEF COMPLAINTS/PURPOSE OF CONSULTATION:  Prostate cancer  HISTORY OF PRESENTING ILLNESS:   [name] Luis Sr. is a wonderful 83 y.o. male who has been referred to [place] by Dr .[name], MD  for evaluation and continued management of prostate cancer.  Patient has a history of hypertension, GERD, non-STEMI, ischemic cardiomyopathy status post CABG in August 2013, chronic indices stage III.  Patient was following up with Dr. Seleta Rhymes (medical oncology) and Dr Braulio Bosch (urology) for management of his prostate cancer. He was apparently noted to have a PSA of 94.3 in 2008 and on bone scan was thought to have stage IV disease metastatic to L2-L3 - subsequently however these findings were thought to possibly represent degenerative changes. He is not sure if he had a biopsy to get a tissue diagnosis. Doesn't remember that he got a biopsy of his prostate or what his Gleason score was. He reports that he had bilateral orchiectomy by Dr. Braulio Bosch on 09/27/2006. His PSA levels apparently dropped down to 0 after his surgery and state they're until about 2012 after which they have been noted to be gradually increasing progressively and were noted to be 1.5 in May 2016 then 2.2 on 03/20/2015 and 2.9 on 09/27/2015.  He has been doing well. He denies bone pain. He reports shortness of breath but, is able to get around and uses oxygen therapy at home. He denies any new health issues since he was last in our clinic. He takes Oscal with D daily. He denies urinary issues, constipation, or chest pain. He has no concerns or complaints at this time.   MEDICAL HISTORY:  Past Medical History:  Diagnosis Date  . CKD (chronic kidney disease) stage 3, GFR 30-59 ml/min   . Coronary atherosclerosis of native coronary artery    Status post CABG - LIMA to LAD, SVG to LCX and SVG  to RCA  . GERD (gastroesophageal reflux disease)   . NSTEMI (non-ST elevated myocardial infarction) [facility]) August 2013  . Prostate cancer ([place])   . Secondary cardiomyopathy ([place]) August 2013   LVEF 30-35% at time of infarct, improved to 60-65% as of 03/2012     SURGICAL HISTORY: Past Surgical History:  Procedure Laterality Date  . CORONARY ARTERY BYPASS GRAFT  11/29/2011   Procedure: CORONARY ARTERY BYPASS GRAFTING (CABG);  Surgeon: Ivin Poot, MD;  Location: [place];  Service: Open Heart Surgery;  Laterality: N/A;  Coronary artery bypass graft time three using left internal mammary artery and right leg saphenous vein harvested endoscopically. Transesophageal echocardiogram performed intraoperatively.  Marland Kitchen HERNIA REPAIR  1970's  . LEFT HEART CATHETERIZATION WITH CORONARY ANGIOGRAM N/A 11/27/2011   Procedure: LEFT HEART CATHETERIZATION WITH CORONARY ANGIOGRAM;  Surgeon: Hillary Bow, MD;  Location: [facility] CATH LAB;  Service: Cardiovascular;  Laterality: N/A;    SOCIAL HISTORY: Social History   Social History  . Marital status: Divorced    Spouse name: N/A  . Number of children: N/A  . Years of education: N/A   Occupational History  . Not on file.   Social History Main Topics  . Smoking status: Former Smoker    Packs/day: 2.00    Years: 40.00    Types: Cigars, Cigarettes    Quit date: 02/25/1989  . Smokeless tobacco: Never Used  . Alcohol use 0.6 oz/week    1 Shots of liquor per  week  . Drug use: No  . Sexual activity: No   Other Topics Concern  . Not on file   Social History Narrative  . No narrative on file    FAMILY HISTORY: History reviewed. No pertinent family history.  ALLERGIES:  is allergic to morphine and related and penicillins.  MEDICATIONS:  Current Outpatient Prescriptions  Medication Sig Dispense Refill  . aspirin 81 MG tablet Take 81 mg by mouth daily. Reported on 08/08/2015    . calcium-vitamin D (OSCAL WITH D) 500-200 MG-UNIT per tablet Take 1 tablet  by mouth daily.    Marland Kitchen docusate sodium (COLACE) 100 MG capsule Take 100 mg by mouth daily as needed. For stool softner    . GuaiFENesin (MUCINEX PO) Take by mouth.    . Multiple Vitamin (MULTIVITAMIN WITH MINERALS) TABS Take 1 tablet by mouth daily.     No current facility-administered medications for this visit.     REVIEW OF SYSTEMS:   Review of Systems  Constitutional: Negative.   HENT: Negative.   Eyes: Negative.   Respiratory: Positive for shortness of breath.   Cardiovascular: Negative.  Negative for chest pain.  Gastrointestinal: Negative.   Genitourinary: Negative.  Negative for dysuria, frequency, hematuria and urgency.  Musculoskeletal: Negative.   Skin: Negative.   Neurological: Negative.   Endo/Heme/Allergies: Negative.   Psychiatric/Behavioral: Negative.   All other systems reviewed and are negative.  10 Point review of Systems was done is negative except as noted above.  PHYSICAL EXAMINATION: ECOG PERFORMANCE STATUS: 2 - Symptomatic, <50% confined to bed  . Vitals:   06/22/16 1011  BP: 130/78  Pulse: 80  Resp: 18  Temp: 98.4 F (36.9 C)   Filed Weights   06/22/16 1011  Weight: 176 lb 8 oz (80.1 kg)   .Body mass index is 26.06 kg/m.  Physical Exam  Constitutional: He is oriented to person, place, and time and well-developed, well-nourished, and in no distress.  HENT:  Head: Normocephalic and atraumatic.  Eyes: Conjunctivae and EOM are normal. Pupils are equal, round, and reactive to light.  Neck: Normal range of motion. Neck supple.  Cardiovascular: Normal rate, regular rhythm and normal heart sounds.   Pulmonary/Chest: Effort normal and breath sounds normal.  Abdominal: Soft. Bowel sounds are normal. There is tenderness (Mild, RLQ).  Musculoskeletal: Normal range of motion.  Neurological: He is alert and oriented to person, place, and time. Gait normal.  Skin: Skin is warm and dry.  Nursing note and vitals reviewed.    LABORATORY DATA:  I have  reviewed the data as listed.  . CBC Latest Ref Rng & Units 06/22/2016 02/19/2016 01/08/2012  WBC 4.0 - 10.5 K/uL 8.1 9.8 8.6  Hemoglobin 13.0 - 17.0 g/dL 13.8 14.4 11.1(L)  Hematocrit 39.0 - 52.0 % 42.5 43.5 33.9(L)  Platelets 150 - 400 K/uL 211 256 345.0    . CMP Latest Ref Rng & Units 06/22/2016 02/19/2016 02/02/2012  Glucose 65 - 99 mg/dL 100(H) 122(H) 87  BUN 6 - 20 mg/dL 35(H) 44(H) 32(H)  Creatinine 0.61 - 1.24 mg/dL 1.55(H) 1.44(H) 1.5  Sodium 135 - 145 mmol/L 137 138 138  Potassium 3.5 - 5.1 mmol/L 5.3(H) 5.1 5.2(H)  Chloride 101 - 111 mmol/L 105 107 103  CO2 22 - 32 mmol/L 26 25 28   Calcium 8.9 - 10.3 mg/dL 9.0 8.9 9.1  Total Protein 6.5 - 8.1 g/dL 7.0 7.4 -  Total Bilirubin 0.3 - 1.2 mg/dL 0.9 0.6 -  Alkaline Phos 38 - 126  U/L 39 38 -  AST 15 - 41 U/L 25 24 -  ALT 17 - 63 U/L 23 22 -   Component     Latest Ref Rng & Units 02/19/2016  PSA     0.00 - 4.00 ng/mL 4.15 (H)  Vitamin D, 25-Hydroxy     30.0 - 100.0 ng/mL 25.6 (L)  Testosterone     264 - 916 ng/dL <3 (L)   PSA today pending   RADIOGRAPHIC STUDIES: I have personally reviewed the radiological images as listed and agreed with the findings in the report. Dg Bone Density  Result Date: 06/15/2016 EXAM: DUAL X-RAY ABSORPTIOMETRY (DXA) FOR BONE MINERAL DENSITY IMPRESSION: Ordering Physician:  Dr. Brunetta Genera, Your patient Meryl Dare completed a BMD test on 06/15/2016 using the [place] (software version: 14.10) manufactured by [employer]. The following summarizes the results of our evaluation. PATIENT BIOGRAPHICAL: Name: [name] [name], [name] [name] Patient ID: [id number] Birth Date: 1926-07-06 Height: 69.0 in. Gender: Male Exam Date: 06/15/2016 Weight: 172.0 lbs. Indications: Caucasian, Height Loss, Prostate Cancer Fractures: Treatments: Calcium, Multivitamin, Vitamin D DENSITOMETRY RESULTS: Site         Region     Measured Date Measured Age WHO Classification Young Adult T-score BMD          %Change vs. Previous Significant Change (*) DualFemur Neck Left 06/15/2016 [age] N/A -1.7 0.848 g/cm2 Left Forearm Radius 33% 06/15/2016 87.9 N/A 0.1 0.817 g/cm2 ASSESSMENT: BMD as determined from Femur Neck Left is 0.848 g/cm2 with a T-Score of -1.7. This patient is considered OSTEOPENIC by World Health Organization [facility]) Criteria. Per official position of the ISCD, it is not possible to quantitatively compare BMD or calculate a LSC between different facilities or devices. (Lumbar spine was not utilized due to advanced degenerative changes.) World Pharmacologist (WHO) criteria for post-menopausal, Caucasian Women: Normal:       T-score at or above -1 SD Osteopenia:   T-score between -1 and -2.5 SD Osteoporosis: T-score at or below -2.5 SD RECOMMENDATIONS: [place] recommends that FDA-approved medial therapies be considered in postmenopausal women and men age 81 or older with a: 1. Hip or vertberal (clinical or morphometric) fracture. 2. T-Score of < -2.5 at the spine or hip. 3. Ten-year fracture probability by FRAX of 3% or greater for hip fracture or 20% or greater for major osteoporotic fracture. All treatment decisions require clinical judgment and consideration of indiviual patient factors, including patient preferences, co-morbidities, previous drug use, risk factors not captured in the FRAX model (e.g. falls, vitamin D deficiency, increased bone turnover, interval significant decline in bone density) and possible under-or over-estimation of fracture risk by FRAX. All patients should ensure an adequate intake of dietary calcium (1200 mg/d) and vitamin D (800 IU daily) unless contraindicated. FOLLOW-UP: People with diagnosed cases of osteoporosis or osteopenia should be regularly tested for bone mineral density. For patients eligible for Medicare, routine testing is allowed once every 2 years. Testing frequency can be increased for patients who have rapidly progressing disease, or  for those who are receiving medical therapy to restore bone mass. I have reviewed this report, and agree with the above findings. Mark A. Thornton Papas, M.D. [facility] Your patient Manav Sigel completed a FRAX assessment on 06/15/2016 using the [place] (analysis version: 14.10) manufactured by [employer]. The following summarizes the results of our evaluation. PATIENT BIOGRAPHICAL: Name: [name] [name], [name] [name] Patient ID: [id number] Birth Date: [birth year] Height:  69.0 in. Gender:     [age]      [age]       Weight:    172.0 lbs. Ethnicity:  White                            Exam Date: 06/15/2016 FRAX* RESULTS:  (version: 3.5) 10-year Probability of Fracture1 Major Osteoporotic Fracture2 Hip Fracture 6.8% 2.9% Population: [place] (Caucasian) Risk Factors: None Based on Femur (Left) Neck BMD 1 -The 10-year probability of fracture may be lower than reported if the patient has received treatment. 2 -Major Osteoporotic Fracture: Clinical Spine, Forearm, Hip or Shoulder *FRAX is a Materials engineer of the [employer] for Metabolic Bone Disease, a [place] (WHO) [facility]. ASSESSMENT: The probability of a major osteoporotic fracture is 6.8% within the next ten years. The probability of a hip fracture is 2.9% within the next ten years. I have reviewed this report and agree with the above findings. Mark A. Thornton Papas, M.D. [facility] Electronically Signed   By: Lavonia Dana M.D.   On: 06/15/2016 10:01   Bone density 06/15/2016 ASSESSMENT: The probability of a major osteoporotic fracture is 6.8% within the next ten years. The probability of a hip fracture is 2.9% within the next ten years.  ASSESSMENT & PLAN:   The patient will continue prostate cancer surveillance.  Review bone density scan with the patient, evidence of osteopenia, cont Oscal-D.  PSA today pending. Last PSA on 02/19/2016 was 4.15 ng/mL. He is currently  asymptomatic.   RTC in 4 months for follow up with labs including, CBC CMP and PSA.   #2. Patient Active Problem List   Diagnosis Date Noted  . Essential hypertension 06/13/2012  . Hyperlipidemia 02/03/2012  . CKD (chronic kidney disease), stage III 01/08/2012  . Coronary atherosclerosis of native coronary artery 11/28/2011   All of the patients questions were answered to his apparent satisfaction. The patient knows to call the clinic with any problems, questions or concerns.  I spent 50 minutes counseling the patient face to face. The total time spent in the appointment was 65 minutes and more than 50% was on counseling and direct patient cares.  This document serves as a record of services personally performed by Twana First, MD. It was created on her behalf by Maryla Morrow, a trained medical scribe. The creation of this record is based on the scribe's personal observations and the provider's statements to them. This document has been checked and approved by the attending provider.  I have reviewed the above documentation for accuracy and completeness and I agree with the above.  Twana First, MD 06/22/2016 10:21 AM

## 2016-06-22 NOTE — Patient Instructions (Signed)
[facility] Discharge Instructions  RECOMMENDATIONS MADE BY THE CONSULTANT AND ANY TEST RESULTS WILL BE SENT TO YOUR REFERRING PHYSICIAN.  You were seen today by Dr. Louise Zhou Follow up in 4 months with lab work See Amy up front for appointments   Thank you for choosing [facility] at [facility] to provide your oncology and hematology care.  To afford each patient quality time with our provider, please arrive at least 15 minutes before your scheduled appointment time.    If you have a lab appointment with the Cancer Center please come in thru the  Main Entrance and check in at the main information desk  You need to re-schedule your appointment should you arrive 10 or more minutes late.  We strive to give you quality time with our providers, and arriving late affects you and other patients whose appointments are after yours.  Also, if you no show three or more times for appointments you may be dismissed from the clinic at the providers discretion.     Again, thank you for choosing [facility].  Our hope is that these requests will decrease the amount of time that you wait before being seen by our physicians.       _____________________________________________________________  Should you have questions after your visit to [facility], please contact our office at [phone] between the hours of 8:30 a.m. and 4:30 p.m.  Voicemails left after 4:30 p.m. will not be returned until the following business day.  For prescription refill requests, have your pharmacy contact our office.       Resources For Cancer Patients and their Caregivers ? American Cancer Society: Can assist with transportation, wigs, general needs, runs Look Good Feel Better.        1-888-227-6333 ? Cancer Care: Provides financial assistance, online support groups, medication/co-pay assistance.  1-800-813-HOPE (4673) ? [facility] Assists [place] cancer patients and their families through emotional , educational and financial support.  336-427-4357 ? [place] DSS Where to apply for food stamps, Medicaid and utility assistance. 336-342-1394 ? [facility]: Transportation to medical appointments. 336-347-2287 ? Social Security Administration: May apply for disability if have a Stage IV cancer. 336-342-7796 1-800-772-1213 ? [place] Aging, Disability and Transit Services: Assists with nutrition, care and transit needs. 336-349-2343  Cancer Center Support Programs: @10RELATIVEDAYS@ > Cancer Support Group  2nd Tuesday of the month 1pm-2pm, Journey Room  > Creative Journey  3rd Tuesday of the month 1130am-1pm, Journey Room  > Look Good Feel Better  1st Wednesday of the month 10am-12 noon, Journey Room (Call American Cancer Society to register 1-800-395-5775)    

## 2016-07-15 DIAGNOSIS — J449 Chronic obstructive pulmonary disease, unspecified: Secondary | ICD-10-CM | POA: Diagnosis not present

## 2016-07-15 DIAGNOSIS — Z87891 Personal history of nicotine dependence: Secondary | ICD-10-CM | POA: Diagnosis not present

## 2016-07-15 DIAGNOSIS — I10 Essential (primary) hypertension: Secondary | ICD-10-CM | POA: Diagnosis not present

## 2016-07-15 DIAGNOSIS — J849 Interstitial pulmonary disease, unspecified: Secondary | ICD-10-CM | POA: Diagnosis not present

## 2016-07-15 DIAGNOSIS — N400 Enlarged prostate without lower urinary tract symptoms: Secondary | ICD-10-CM | POA: Diagnosis not present

## 2016-07-15 DIAGNOSIS — Z713 Dietary counseling and surveillance: Secondary | ICD-10-CM | POA: Diagnosis not present

## 2016-07-15 DIAGNOSIS — Z299 Encounter for prophylactic measures, unspecified: Secondary | ICD-10-CM | POA: Diagnosis not present

## 2016-07-15 DIAGNOSIS — Z6826 Body mass index (BMI) 26.0-26.9, adult: Secondary | ICD-10-CM | POA: Diagnosis not present

## 2016-08-06 NOTE — Progress Notes (Signed)
Cardiology Office Note  Date: 08/07/2016   ID: [name] Luis Sr., DOB [birth year], MRN [id number]  PCP: [name] Chroman, MD  Primary Cardiologist: [name] Lesches, MD   Chief Complaint  Patient presents with  . Coronary Artery Disease    History of Present Illness: [name] KINER Sr. is an 83 y.o. male last seen in October 2017. He presents for a routine follow-up visit. 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 He has had a slow decline, using oxygen almost continuously now but remains functional with basic ADLs and wants to stay in his own home. He has dogs that he takes outside but is not able to do much yard work.  I reviewed his cardiac medications which are limited. He is essentially on aspirin alone at this point. He has preferred to hold off on other therapies. I personal reviewed his ECG today which shows sinus rhythm with left anterior fascicular block, R' in lead V1 and V2.  Follow-up echocardiogram from October 2017 is outlined below. LVEF 55-60%.  Past Medical History:  Diagnosis Date  . CKD (chronic kidney disease) stage 3, GFR 30-59 ml/min   . Coronary atherosclerosis of native coronary artery    Status post CABG - LIMA to LAD, SVG to LCX and SVG to RCA  . GERD (gastroesophageal reflux disease)   . NSTEMI (non-ST elevated myocardial infarction) [facility]) August 2013  . Prostate cancer ([employer])   . Secondary cardiomyopathy [facility]) August 2013   LVEF 30-35% at time of infarct, improved to 60-65% as of 03/2012     Past Surgical History:  Procedure Laterality Date  . CORONARY ARTERY BYPASS GRAFT  11/29/2011   Procedure: CORONARY ARTERY BYPASS GRAFTING (CABG);  Surgeon: [name] Poot, MD;  Location: [place];  Service: Open Heart Surgery;  Laterality: N/A;  Coronary artery bypass graft time three using left internal mammary artery and right leg saphenous vein harvested endoscopically.  Transesophageal echocardiogram performed intraoperatively.  Marland Kitchen HERNIA REPAIR  1970's  . LEFT HEART CATHETERIZATION WITH CORONARY ANGIOGRAM N/A 11/27/2011   Procedure: LEFT HEART CATHETERIZATION WITH CORONARY ANGIOGRAM;  Surgeon: [name] Bow, MD;  Location[facility];  Service: Cardiovascular;  Laterality: N/A;    Current Outpatient Prescriptions  Medication Sig Dispense Refill  . aspirin 81 MG tablet Take 81 mg by mouth daily. Reported on 08/08/2015    . calcium-vitamin D (OSCAL WITH D) 500-200 MG-UNIT per tablet Take 1 tablet by mouth daily.    Marland Kitchen docusate sodium (COLACE) 100 MG capsule Take 100 mg by mouth daily as needed. For stool softner    . GuaiFENesin (MUCINEX PO) Take by mouth.    . Multiple Vitamin (MULTIVITAMIN WITH MINERALS) TABS Take 1 tablet by mouth daily.     No current facility-administered medications for this visit.    Allergies:  Morphine and related and Penicillins   Social History: The patient  reports that he quit smoking about 27 years ago. His smoking use included Cigars and Cigarettes. He has a 80.00 pack-year smoking history. He has never used smokeless tobacco. He reports that he drinks about 0.6 oz of alcohol per week . He reports that he does not use drugs.   ROS:  Please see the history of present illness. Otherwise, complete review of systems is positive for dyspnea on exertion.  All other systems are reviewed and negative.  Physical Exam: VS:  BP 94/62   Pulse (!) 102   Ht 5\' 9"  (1.753 m)   Wt 185 lb (83.9 kg)   SpO2 (!) 80%   BMI 27.32 kg/m , BMI Body mass index is 27.32 kg/m.  Wt Readings from Last 3 Encounters:  08/07/16 185 lb (83.9 kg)  06/22/16 176 lb 8 oz (80.1 kg)  02/19/16 174 lb 11.2 oz (79.2 kg)    General: Elderly male, no distress. Wearing oxygen via nasal cannula. HEENT: Conjunctiva and lids normal, oropharynx clear. Neck: Supple, no elevated JVP or carotid bruits, no thyromegaly. Lungs: Decreased breath sounds without  wheezing, nonlabored breathing at rest. Cardiac: Regular rate and rhythm, no S3 or significant systolic murmur, no pericardial rub. Abdomen: Soft, nontender, bowel sounds present, no guarding or rebound. Extremities: No pitting edema, distal pulses 2+. Skin: Warm and dry. Musculoskeletal: Mild kyphosis. Neuropsychiatric: Alert and oriented 3, affect appropriate.  ECG: I personally reviewed the tracing from 08/08/2015 which showed sinus rhythm with left anterior fascicular block and nonspecific T-wave abnormalities.  Recent Labwork: 06/22/2016: ALT 23; AST 25; BUN 35; Creatinine, Ser 1.55; Hemoglobin 13.8; Platelets 211; Potassium 5.3; Sodium 137   Other Studies Reviewed Today:  Echocardiogram 02/11/2016: Study Conclusions  - Left ventricle: The cavity size was normal. Wall thickness was   normal. Systolic function was normal. The estimated ejection   fraction was in the range of 55% to 60%. Indeterminate diastolic   function. - Aortic valve: Mildly calcified annulus. Trileaflet; mildly   thickened leaflets. Valve area (VTI): 2.7 cm^2. Valve area   (Vmax): 2.53 cm^2. Valve area (Vmean): 2.52 cm^2. - Mitral valve: There was mild regurgitation. - Left atrium: The atrium was mildly dilated. - Right ventricle: The cavity size was moderately dilated. The RV   shares the apex with the LV. Systolic function was mildly   reduced. TAPSE: 14 mm . - Right atrium: The atrium was mildly dilated. - Pulmonary arteries: Systolic pressure was moderately to severely   increased. PA peak pressure: 64 mm Hg (S). - Technically difficult study.  Assessment and Plan:  1. CAD status post CABG in 2013. He does not report any angina symptoms. I reviewed his ECG today which is overall stable. He continues on aspirin, no other regular cardiac therapies, he has preferred to keep medical regimen very simple. Anticipate conservative follow-up.  2. Progressive dyspnea and hypoxic respiratory failure. He is on  oxygen and being managed by [name] [name]. Echocardiogram from October 2017 showed severe pulmonary hypertension.  3. CKD, stage 3. Last creatinine 1.55.  4. History of cardiomyopathy with normalization of LVEF, most recently 55-60% as of October 2017. No evidence of volume overload at this time.  Current medicines were reviewed with the patient today.   Orders Placed This Encounter  Procedures  . EKG 12-Lead    Disposition: Follow-up in 6 months.  Signed, Satira Sark, MD, [facility] 08/07/2016 9:30 AM    [facility], [place], [place] [address] Phone: [phone]; Fax: [phone]

## 2016-08-07 ENCOUNTER — Ambulatory Visit (INDEPENDENT_AMBULATORY_CARE_PROVIDER_SITE_OTHER): Payer: Medicare Other | Admitting: Cardiology

## 2016-08-07 ENCOUNTER — Encounter: Payer: Self-pay | Admitting: Cardiology

## 2016-08-07 VITALS — BP 94/62 | HR 102 | Ht 69.0 in | Wt 185.0 lb

## 2016-08-07 DIAGNOSIS — N183 Chronic kidney disease, stage 3 unspecified: Secondary | ICD-10-CM

## 2016-08-07 DIAGNOSIS — Z8679 Personal history of other diseases of the circulatory system: Secondary | ICD-10-CM

## 2016-08-07 DIAGNOSIS — J9611 Chronic respiratory failure with hypoxia: Secondary | ICD-10-CM

## 2016-08-07 DIAGNOSIS — I2510 Atherosclerotic heart disease of native coronary artery without angina pectoris: Secondary | ICD-10-CM

## 2016-08-07 NOTE — Patient Instructions (Signed)

## 2016-08-12 DIAGNOSIS — H35351 Cystoid macular degeneration, right eye: Secondary | ICD-10-CM | POA: Diagnosis not present

## 2016-08-12 DIAGNOSIS — H25041 Posterior subcapsular polar age-related cataract, right eye: Secondary | ICD-10-CM | POA: Diagnosis not present

## 2016-08-12 DIAGNOSIS — H348312 Tributary (branch) retinal vein occlusion, right eye, stable: Secondary | ICD-10-CM | POA: Diagnosis not present

## 2016-08-12 DIAGNOSIS — H25042 Posterior subcapsular polar age-related cataract, left eye: Secondary | ICD-10-CM | POA: Diagnosis not present

## 2016-08-12 DIAGNOSIS — H43811 Vitreous degeneration, right eye: Secondary | ICD-10-CM | POA: Diagnosis not present

## 2016-08-15 DIAGNOSIS — S20361A Insect bite (nonvenomous) of right front wall of thorax, initial encounter: Secondary | ICD-10-CM | POA: Diagnosis not present

## 2016-08-15 DIAGNOSIS — R21 Rash and other nonspecific skin eruption: Secondary | ICD-10-CM | POA: Diagnosis not present

## 2016-08-26 DIAGNOSIS — N400 Enlarged prostate without lower urinary tract symptoms: Secondary | ICD-10-CM | POA: Diagnosis not present

## 2016-08-26 DIAGNOSIS — I10 Essential (primary) hypertension: Secondary | ICD-10-CM | POA: Diagnosis not present

## 2016-08-26 DIAGNOSIS — Z299 Encounter for prophylactic measures, unspecified: Secondary | ICD-10-CM | POA: Diagnosis not present

## 2016-08-26 DIAGNOSIS — I509 Heart failure, unspecified: Secondary | ICD-10-CM | POA: Diagnosis not present

## 2016-08-26 DIAGNOSIS — Z6827 Body mass index (BMI) 27.0-27.9, adult: Secondary | ICD-10-CM | POA: Diagnosis not present

## 2016-08-26 DIAGNOSIS — Z87891 Personal history of nicotine dependence: Secondary | ICD-10-CM | POA: Diagnosis not present

## 2016-08-26 DIAGNOSIS — J849 Interstitial pulmonary disease, unspecified: Secondary | ICD-10-CM | POA: Diagnosis not present

## 2016-08-26 DIAGNOSIS — M7989 Other specified soft tissue disorders: Secondary | ICD-10-CM | POA: Diagnosis not present

## 2016-08-28 DIAGNOSIS — M7989 Other specified soft tissue disorders: Secondary | ICD-10-CM | POA: Diagnosis not present

## 2016-08-28 DIAGNOSIS — I10 Essential (primary) hypertension: Secondary | ICD-10-CM | POA: Diagnosis not present

## 2016-08-28 DIAGNOSIS — Z299 Encounter for prophylactic measures, unspecified: Secondary | ICD-10-CM | POA: Diagnosis not present

## 2016-08-28 DIAGNOSIS — N400 Enlarged prostate without lower urinary tract symptoms: Secondary | ICD-10-CM | POA: Diagnosis not present

## 2016-08-28 DIAGNOSIS — J449 Chronic obstructive pulmonary disease, unspecified: Secondary | ICD-10-CM | POA: Diagnosis not present

## 2016-08-28 DIAGNOSIS — J849 Interstitial pulmonary disease, unspecified: Secondary | ICD-10-CM | POA: Diagnosis not present

## 2016-08-28 DIAGNOSIS — Z6825 Body mass index (BMI) 25.0-25.9, adult: Secondary | ICD-10-CM | POA: Diagnosis not present

## 2016-08-28 DIAGNOSIS — I509 Heart failure, unspecified: Secondary | ICD-10-CM | POA: Diagnosis not present

## 2016-09-02 DIAGNOSIS — I10 Essential (primary) hypertension: Secondary | ICD-10-CM | POA: Diagnosis not present

## 2016-09-02 DIAGNOSIS — Z299 Encounter for prophylactic measures, unspecified: Secondary | ICD-10-CM | POA: Diagnosis not present

## 2016-09-02 DIAGNOSIS — J449 Chronic obstructive pulmonary disease, unspecified: Secondary | ICD-10-CM | POA: Diagnosis not present

## 2016-09-02 DIAGNOSIS — Z6826 Body mass index (BMI) 26.0-26.9, adult: Secondary | ICD-10-CM | POA: Diagnosis not present

## 2016-09-02 DIAGNOSIS — I509 Heart failure, unspecified: Secondary | ICD-10-CM | POA: Diagnosis not present

## 2016-09-02 DIAGNOSIS — J849 Interstitial pulmonary disease, unspecified: Secondary | ICD-10-CM | POA: Diagnosis not present

## 2016-09-30 DIAGNOSIS — J449 Chronic obstructive pulmonary disease, unspecified: Secondary | ICD-10-CM | POA: Diagnosis not present

## 2016-09-30 DIAGNOSIS — I10 Essential (primary) hypertension: Secondary | ICD-10-CM | POA: Diagnosis not present

## 2016-09-30 DIAGNOSIS — J849 Interstitial pulmonary disease, unspecified: Secondary | ICD-10-CM | POA: Diagnosis not present

## 2016-09-30 DIAGNOSIS — Z79899 Other long term (current) drug therapy: Secondary | ICD-10-CM | POA: Diagnosis not present

## 2016-09-30 DIAGNOSIS — Z299 Encounter for prophylactic measures, unspecified: Secondary | ICD-10-CM | POA: Diagnosis not present

## 2016-09-30 DIAGNOSIS — I509 Heart failure, unspecified: Secondary | ICD-10-CM | POA: Diagnosis not present

## 2016-09-30 DIAGNOSIS — Z713 Dietary counseling and surveillance: Secondary | ICD-10-CM | POA: Diagnosis not present

## 2016-09-30 DIAGNOSIS — N400 Enlarged prostate without lower urinary tract symptoms: Secondary | ICD-10-CM | POA: Diagnosis not present

## 2016-09-30 DIAGNOSIS — Z6826 Body mass index (BMI) 26.0-26.9, adult: Secondary | ICD-10-CM | POA: Diagnosis not present

## 2016-10-07 DIAGNOSIS — Z79899 Other long term (current) drug therapy: Secondary | ICD-10-CM | POA: Diagnosis not present

## 2016-10-14 DIAGNOSIS — Z79899 Other long term (current) drug therapy: Secondary | ICD-10-CM | POA: Diagnosis not present

## 2016-10-15 DIAGNOSIS — J449 Chronic obstructive pulmonary disease, unspecified: Secondary | ICD-10-CM | POA: Diagnosis not present

## 2016-10-15 DIAGNOSIS — Z299 Encounter for prophylactic measures, unspecified: Secondary | ICD-10-CM | POA: Diagnosis not present

## 2016-10-15 DIAGNOSIS — I10 Essential (primary) hypertension: Secondary | ICD-10-CM | POA: Diagnosis not present

## 2016-10-15 DIAGNOSIS — N184 Chronic kidney disease, stage 4 (severe): Secondary | ICD-10-CM | POA: Diagnosis not present

## 2016-10-15 DIAGNOSIS — M109 Gout, unspecified: Secondary | ICD-10-CM | POA: Diagnosis not present

## 2016-10-20 ENCOUNTER — Ambulatory Visit (HOSPITAL_COMMUNITY): Payer: Medicare Other

## 2016-10-20 ENCOUNTER — Encounter (HOSPITAL_COMMUNITY): Payer: Self-pay

## 2016-10-20 ENCOUNTER — Other Ambulatory Visit (HOSPITAL_COMMUNITY): Payer: Medicare Other

## 2016-10-20 ENCOUNTER — Encounter (HOSPITAL_BASED_OUTPATIENT_CLINIC_OR_DEPARTMENT_OTHER): Payer: Medicare Other | Admitting: Oncology

## 2016-10-20 ENCOUNTER — Encounter (HOSPITAL_COMMUNITY): Payer: Medicare Other | Attending: Oncology

## 2016-10-20 DIAGNOSIS — Z885 Allergy status to narcotic agent status: Secondary | ICD-10-CM | POA: Insufficient documentation

## 2016-10-20 DIAGNOSIS — I10 Essential (primary) hypertension: Secondary | ICD-10-CM

## 2016-10-20 DIAGNOSIS — Z87891 Personal history of nicotine dependence: Secondary | ICD-10-CM | POA: Insufficient documentation

## 2016-10-20 DIAGNOSIS — I255 Ischemic cardiomyopathy: Secondary | ICD-10-CM | POA: Diagnosis not present

## 2016-10-20 DIAGNOSIS — Z79899 Other long term (current) drug therapy: Secondary | ICD-10-CM | POA: Insufficient documentation

## 2016-10-20 DIAGNOSIS — J8410 Pulmonary fibrosis, unspecified: Secondary | ICD-10-CM | POA: Insufficient documentation

## 2016-10-20 DIAGNOSIS — I2510 Atherosclerotic heart disease of native coronary artery without angina pectoris: Secondary | ICD-10-CM | POA: Diagnosis not present

## 2016-10-20 DIAGNOSIS — E785 Hyperlipidemia, unspecified: Secondary | ICD-10-CM | POA: Diagnosis not present

## 2016-10-20 DIAGNOSIS — Z9981 Dependence on supplemental oxygen: Secondary | ICD-10-CM

## 2016-10-20 DIAGNOSIS — C61 Malignant neoplasm of prostate: Secondary | ICD-10-CM

## 2016-10-20 DIAGNOSIS — Z7982 Long term (current) use of aspirin: Secondary | ICD-10-CM | POA: Diagnosis not present

## 2016-10-20 DIAGNOSIS — M109 Gout, unspecified: Secondary | ICD-10-CM | POA: Insufficient documentation

## 2016-10-20 DIAGNOSIS — Z880 Allergy status to penicillin: Secondary | ICD-10-CM | POA: Diagnosis not present

## 2016-10-20 DIAGNOSIS — Z951 Presence of aortocoronary bypass graft: Secondary | ICD-10-CM | POA: Insufficient documentation

## 2016-10-20 DIAGNOSIS — N183 Chronic kidney disease, stage 3 (moderate): Secondary | ICD-10-CM | POA: Diagnosis not present

## 2016-10-20 DIAGNOSIS — I129 Hypertensive chronic kidney disease with stage 1 through stage 4 chronic kidney disease, or unspecified chronic kidney disease: Secondary | ICD-10-CM | POA: Diagnosis not present

## 2016-10-20 DIAGNOSIS — I252 Old myocardial infarction: Secondary | ICD-10-CM | POA: Insufficient documentation

## 2016-10-20 DIAGNOSIS — K219 Gastro-esophageal reflux disease without esophagitis: Secondary | ICD-10-CM | POA: Diagnosis not present

## 2016-10-20 LAB — COMPREHENSIVE METABOLIC PANEL
ALT: 31 U/L (ref 17–63)
ANION GAP: 8 (ref 5–15)
AST: 31 U/L (ref 15–41)
Albumin: 3.7 g/dL (ref 3.5–5.0)
Alkaline Phosphatase: 63 U/L (ref 38–126)
BILIRUBIN TOTAL: 1.5 mg/dL — AB (ref 0.3–1.2)
BUN: 54 mg/dL — AB (ref 6–20)
CHLORIDE: 107 mmol/L (ref 101–111)
CO2: 25 mmol/L (ref 22–32)
Calcium: 8.5 mg/dL — ABNORMAL LOW (ref 8.9–10.3)
Creatinine, Ser: 1.74 mg/dL — ABNORMAL HIGH (ref 0.61–1.24)
GFR, EST AFRICAN AMERICAN: 39 mL/min — AB (ref >60)
GFR, EST NON AFRICAN AMERICAN: 33 mL/min — AB (ref >60)
Glucose, Bld: 120 mg/dL — ABNORMAL HIGH (ref 65–99)
Potassium: 5.2 mmol/L — ABNORMAL HIGH (ref 3.5–5.1)
Sodium: 140 mmol/L (ref 135–145)
TOTAL PROTEIN: 6.6 g/dL (ref 6.5–8.1)

## 2016-10-20 LAB — CBC WITH DIFFERENTIAL/PLATELET
BASOS ABS: 0 10*3/uL (ref 0.0–0.1)
BASOS PCT: 0 %
EOS PCT: 1 %
Eosinophils Absolute: 0.1 10*3/uL (ref 0.0–0.7)
HCT: 44.1 % (ref 39.0–52.0)
Hemoglobin: 14.5 g/dL (ref 13.0–17.0)
LYMPHS PCT: 12 %
Lymphs Abs: 1.1 10*3/uL (ref 0.7–4.0)
MCH: 31.7 pg (ref 26.0–34.0)
MCHC: 32.9 g/dL (ref 30.0–36.0)
MCV: 96.3 fL (ref 78.0–100.0)
MONO ABS: 0.7 10*3/uL (ref 0.1–1.0)
MONOS PCT: 8 %
Neutro Abs: 7.6 10*3/uL (ref 1.7–7.7)
Neutrophils Relative %: 79 %
PLATELETS: 194 10*3/uL (ref 150–400)
RBC: 4.58 MIL/uL (ref 4.22–5.81)
RDW: 15.8 % — AB (ref 11.5–15.5)
WBC: 9.5 10*3/uL (ref 4.0–10.5)

## 2016-10-20 LAB — PSA: PROSTATIC SPECIFIC ANTIGEN: 7.72 ng/mL — AB (ref 0.00–4.00)

## 2016-10-20 NOTE — Progress Notes (Signed)
HEMATOLOGY/ONCOLOGY FOLLOW UP NOTE  Date of Service: 10/20/2016  Patient Care Team: [name], MD as PCP - General (Internal Medicine)  CHIEF COMPLAINTS/PURPOSE OF CONSULTATION:  Prostate cancer  HISTORY OF PRESENTING ILLNESS:   [name] Luis Sr. is a wonderful 83 y.o. male who has been referred to [place] by Dr .Woody Seller, Costella Hatcher, MD  for evaluation and continued management of prostate cancer.  Patient has a history of hypertension, GERD, non-STEMI, ischemic cardiomyopathy status post CABG in August 2013, chronic indices stage III.  Patient was following up with Dr. Seleta Rhymes (medical oncology) and Dr Braulio Bosch (urology) for management of his prostate cancer. He was apparently noted to have a PSA of 94.3 in 2008 and on bone scan was thought to have stage IV disease metastatic to L2-L3 - subsequently however these findings were thought to possibly represent degenerative changes. He is not sure if he had a biopsy to get a tissue diagnosis. Doesn't remember that he got a biopsy of his prostate or what his Gleason score was. He reports that he had bilateral orchiectomy by Dr. Braulio Bosch on 09/27/2006. His PSA levels apparently dropped down to 0 after his surgery and state they're until about 2012 after which they have been noted to be gradually increasing progressively and were noted to be 1.5 in May 2016 then 2.2 on 03/20/2015 and 2.9 on 09/27/2015.  He has been doing well, except for a recent gout flare in his foot. He denies any bone pain. He continues to have chronic shortness of breath from his pulmonary fibrosis for which he is oxygen dependent. Otherwise he has no complaints.  MEDICAL HISTORY:  Past Medical History:  Diagnosis Date  . CKD (chronic kidney disease) stage 3, GFR 30-59 ml/min   . Coronary atherosclerosis of native coronary artery    Status post CABG - LIMA to LAD, SVG to LCX and SVG to RCA  . GERD (gastroesophageal reflux disease)   . NSTEMI (non-ST elevated myocardial  infarction) [facility]) August 2013  . Prostate cancer ([place])   . Secondary cardiomyopathy ([place]) August 2013   LVEF 30-35% at time of infarct, improved to 60-65% as of 03/2012     SURGICAL HISTORY: Past Surgical History:  Procedure Laterality Date  . CORONARY ARTERY BYPASS GRAFT  11/29/2011   Procedure: CORONARY ARTERY BYPASS GRAFTING (CABG);  Surgeon: Ivin Poot, MD;  Location: [place];  Service: Open Heart Surgery;  Laterality: N/A;  Coronary artery bypass graft time three using left internal mammary artery and right leg saphenous vein harvested endoscopically. Transesophageal echocardiogram performed intraoperatively.  Marland Kitchen HERNIA REPAIR  1970's  . LEFT HEART CATHETERIZATION WITH CORONARY ANGIOGRAM N/A 11/27/2011   Procedure: LEFT HEART CATHETERIZATION WITH CORONARY ANGIOGRAM;  Surgeon: Hillary Bow, MD;  Location: [facility] CATH LAB;  Service: Cardiovascular;  Laterality: N/A;    SOCIAL HISTORY: Social History   Social History  . Marital status: Divorced    Spouse name: N/A  . Number of children: N/A  . Years of education: N/A   Occupational History  . Not on file.   Social History Main Topics  . Smoking status: Former Smoker    Packs/day: 2.00    Years: 40.00    Types: Cigars, Cigarettes    Quit date: 02/25/1989  . Smokeless tobacco: Never Used  . Alcohol use 0.6 oz/week    1 Shots of liquor per week  . Drug use: No  . Sexual activity: No   Other Topics Concern  . Not on file  Social History Narrative  . No narrative on file    FAMILY HISTORY: History reviewed. No pertinent family history.  ALLERGIES:  is allergic to morphine and related and penicillins.  MEDICATIONS:  Current Outpatient Prescriptions  Medication Sig Dispense Refill  . aspirin 81 MG tablet Take 81 mg by mouth daily. Reported on 08/08/2015    . calcium-vitamin D (OSCAL WITH D) 500-200 MG-UNIT per tablet Take 1 tablet by mouth daily.    Marland Kitchen docusate sodium (COLACE) 100 MG capsule Take 100 mg by mouth daily  as needed. For stool softner    . furosemide (LASIX) 40 MG tablet Take 40 mg by mouth.    . GuaiFENesin (MUCINEX PO) Take by mouth.    . Multiple Vitamin (MULTIVITAMIN WITH MINERALS) TABS Take 1 tablet by mouth daily.    Marland Kitchen POTASSIUM CHLORIDE ER PO Take by mouth.     No current facility-administered medications for this visit.     REVIEW OF SYSTEMS:   Review of Systems  Constitutional: Negative.   HENT: Negative.   Eyes: Negative.   Respiratory: Positive for shortness of breath.   Cardiovascular: Negative.  Negative for chest pain.  Gastrointestinal: Negative.   Genitourinary: Negative.  Negative for dysuria, frequency, hematuria and urgency.  Musculoskeletal: Negative.        Right foot pain due to gout flare  Skin: Negative.   Neurological: Negative.   Endo/Heme/Allergies: Negative.   Psychiatric/Behavioral: Negative.   All other systems reviewed and are negative.  10 Point review of Systems was done is negative except as noted above.  PHYSICAL EXAMINATION: ECOG PERFORMANCE STATUS: 2 - Symptomatic, <50% confined to bed  . Vitals:   10/20/16 1032  BP: 108/67  Pulse: 88  Resp: 18   Filed Weights   10/20/16 1032  Weight: 175 lb (79.4 kg)   .Body mass index is 25.11 kg/m.  Physical Exam  Constitutional: He is oriented to person, place, and time and well-developed, well-nourished, and in no distress.  HENT:  Head: Normocephalic and atraumatic.  Eyes: Conjunctivae and EOM are normal. Pupils are equal, round, and reactive to light.  Neck: Normal range of motion. Neck supple.  Cardiovascular: Normal rate, regular rhythm and normal heart sounds.   Pulmonary/Chest: Effort normal and breath sounds normal.  Bilateral basilar crackles  Abdominal: Soft. Bowel sounds are normal. There is no tenderness.  Musculoskeletal: Normal range of motion.  Neurological: He is alert and oriented to person, place, and time. Gait normal.  Skin: Skin is warm and dry.  Nursing note and  vitals reviewed.    LABORATORY DATA:  I have reviewed the data as listed.  . CBC Latest Ref Rng & Units 10/20/2016 06/22/2016 02/19/2016  WBC 4.0 - 10.5 K/uL 9.5 8.1 9.8  Hemoglobin 13.0 - 17.0 g/dL 14.5 13.8 14.4  Hematocrit 39.0 - 52.0 % 44.1 42.5 43.5  Platelets 150 - 400 K/uL 194 211 256    . CMP Latest Ref Rng & Units 10/20/2016 06/22/2016 02/19/2016  Glucose 65 - 99 mg/dL 120(H) 100(H) 122(H)  BUN 6 - 20 mg/dL 54(H) 35(H) 44(H)  Creatinine 0.61 - 1.24 mg/dL 1.74(H) 1.55(H) 1.44(H)  Sodium 135 - 145 mmol/L 140 137 138  Potassium 3.5 - 5.1 mmol/L 5.2(H) 5.3(H) 5.1  Chloride 101 - 111 mmol/L 107 105 107  CO2 22 - 32 mmol/L 25 26 25   Calcium 8.9 - 10.3 mg/dL 8.5(L) 9.0 8.9  Total Protein 6.5 - 8.1 g/dL 6.6 7.0 7.4  Total Bilirubin 0.3 - 1.2  mg/dL 1.5(H) 0.9 0.6  Alkaline Phos 38 - 126 U/L 63 39 38  AST 15 - 41 U/L 31 25 24   ALT 17 - 63 U/L 31 23 22    Component     Latest Ref Rng & Units 02/19/2016  PSA     0.00 - 4.00 ng/mL 4.15 (H)  Vitamin D, 25-Hydroxy     30.0 - 100.0 ng/mL 25.6 (L)  Testosterone     264 - 916 ng/dL <3 (L)  Results for [name], FANDRICH SR. (MRN [id number]) as of 10/20/2016 11:31  Ref. Range 02/19/2016 14:24 06/22/2016 09:14  PSA Latest Ref Range: 0.00 - 4.00 ng/mL 4.15 (H) 4.95 (H)   PSA today pending   RADIOGRAPHIC STUDIES: I have personally reviewed the radiological images as listed and agreed with the findings in the report. No results found. Bone density 06/15/2016 ASSESSMENT: The probability of a major osteoporotic fracture is 6.8% within the next ten years. The probability of a hip fracture is 2.9% within the next ten years.  ASSESSMENT & PLAN:  history of prostate cancer diagnosed in 2008. ?metastatic to L2-L3 (thought to be metastatic on bone scan but could not rule out presence of degenerative change ) His pretreatment PSA of the time was apparently 94.3 based on outside records . He had bilateral orchiectomy by Dr. Braulio Bosch in June  2008 . His PSA levels are apparently dropped to 0 and have been gradually increasing from 2012 . His PSA levels increased to 4.15 up from 2.9 in June 2017 up from 2. 02/27/2015 up from 1.6 in May 2016 . Patient's PSA doubling time appears to be longer than 6 months .  PLAN: Continue prostate cancer surveillance. No active treatment needed at this time. PSA rising very slowly. PSA pending for today. He is currently asymptomatic.   RTC in 4 months for follow up with labs including, testosterone level, CBC, CMP, and PSA.   #2. Patient Active Problem List   Diagnosis Date Noted  . Prostate cancer ([place]) 10/20/2016  . Essential hypertension 06/13/2012  . Hyperlipidemia 02/03/2012  . CKD (chronic kidney disease), stage III 01/08/2012  . Coronary atherosclerosis of native coronary artery 11/28/2011   All of the patients questions were answered to his apparent satisfaction. The patient knows to call the clinic with any problems, questions or concerns.   Twana First, MD 10/20/2016 11:31 AM

## 2016-10-20 NOTE — Progress Notes (Signed)
Please call the patient and let him know his PSA level. We will continue to keep his appt in 6 months and observe his PSA.

## 2016-10-29 DIAGNOSIS — I10 Essential (primary) hypertension: Secondary | ICD-10-CM | POA: Diagnosis not present

## 2016-10-29 DIAGNOSIS — M7989 Other specified soft tissue disorders: Secondary | ICD-10-CM | POA: Diagnosis not present

## 2016-10-29 DIAGNOSIS — J849 Interstitial pulmonary disease, unspecified: Secondary | ICD-10-CM | POA: Diagnosis not present

## 2016-10-29 DIAGNOSIS — N184 Chronic kidney disease, stage 4 (severe): Secondary | ICD-10-CM | POA: Diagnosis not present

## 2016-10-29 DIAGNOSIS — Z299 Encounter for prophylactic measures, unspecified: Secondary | ICD-10-CM | POA: Diagnosis not present

## 2016-10-29 DIAGNOSIS — N400 Enlarged prostate without lower urinary tract symptoms: Secondary | ICD-10-CM | POA: Diagnosis not present

## 2016-10-29 DIAGNOSIS — J449 Chronic obstructive pulmonary disease, unspecified: Secondary | ICD-10-CM | POA: Diagnosis not present

## 2016-10-29 DIAGNOSIS — I2721 Secondary pulmonary arterial hypertension: Secondary | ICD-10-CM | POA: Diagnosis not present

## 2016-10-29 DIAGNOSIS — I509 Heart failure, unspecified: Secondary | ICD-10-CM | POA: Diagnosis not present

## 2016-11-19 DIAGNOSIS — I2721 Secondary pulmonary arterial hypertension: Secondary | ICD-10-CM | POA: Diagnosis not present

## 2016-11-19 DIAGNOSIS — J849 Interstitial pulmonary disease, unspecified: Secondary | ICD-10-CM | POA: Diagnosis not present

## 2016-11-19 DIAGNOSIS — I509 Heart failure, unspecified: Secondary | ICD-10-CM | POA: Diagnosis not present

## 2016-11-19 DIAGNOSIS — N184 Chronic kidney disease, stage 4 (severe): Secondary | ICD-10-CM | POA: Diagnosis not present

## 2016-11-19 DIAGNOSIS — Z299 Encounter for prophylactic measures, unspecified: Secondary | ICD-10-CM | POA: Diagnosis not present

## 2016-11-19 DIAGNOSIS — I10 Essential (primary) hypertension: Secondary | ICD-10-CM | POA: Diagnosis not present

## 2016-11-19 DIAGNOSIS — Z79899 Other long term (current) drug therapy: Secondary | ICD-10-CM | POA: Diagnosis not present

## 2016-11-19 DIAGNOSIS — J449 Chronic obstructive pulmonary disease, unspecified: Secondary | ICD-10-CM | POA: Diagnosis not present

## 2016-11-30 DIAGNOSIS — H3562 Retinal hemorrhage, left eye: Secondary | ICD-10-CM | POA: Diagnosis not present

## 2016-12-01 DIAGNOSIS — E875 Hyperkalemia: Secondary | ICD-10-CM | POA: Diagnosis not present

## 2016-12-02 ENCOUNTER — Inpatient Hospital Stay
Admission: EM | Admit: 2016-12-02 | Payer: Self-pay | Source: Other Acute Inpatient Hospital | Admitting: Pulmonary Disease

## 2016-12-02 DIAGNOSIS — H43812 Vitreous degeneration, left eye: Secondary | ICD-10-CM | POA: Diagnosis not present

## 2016-12-02 DIAGNOSIS — C61 Malignant neoplasm of prostate: Secondary | ICD-10-CM | POA: Diagnosis not present

## 2016-12-02 DIAGNOSIS — Z8674 Personal history of sudden cardiac arrest: Secondary | ICD-10-CM | POA: Diagnosis not present

## 2016-12-02 DIAGNOSIS — Z431 Encounter for attention to gastrostomy: Secondary | ICD-10-CM | POA: Diagnosis not present

## 2016-12-02 DIAGNOSIS — E874 Mixed disorder of acid-base balance: Secondary | ICD-10-CM | POA: Diagnosis not present

## 2016-12-02 DIAGNOSIS — I2510 Atherosclerotic heart disease of native coronary artery without angina pectoris: Secondary | ICD-10-CM | POA: Diagnosis not present

## 2016-12-02 DIAGNOSIS — R918 Other nonspecific abnormal finding of lung field: Secondary | ICD-10-CM | POA: Diagnosis not present

## 2016-12-02 DIAGNOSIS — I509 Heart failure, unspecified: Secondary | ICD-10-CM | POA: Diagnosis not present

## 2016-12-02 DIAGNOSIS — Z978 Presence of other specified devices: Secondary | ICD-10-CM | POA: Diagnosis not present

## 2016-12-02 DIAGNOSIS — I25810 Atherosclerosis of coronary artery bypass graft(s) without angina pectoris: Secondary | ICD-10-CM | POA: Diagnosis not present

## 2016-12-02 DIAGNOSIS — H26492 Other secondary cataract, left eye: Secondary | ICD-10-CM | POA: Diagnosis not present

## 2016-12-02 DIAGNOSIS — H26491 Other secondary cataract, right eye: Secondary | ICD-10-CM | POA: Diagnosis not present

## 2016-12-02 DIAGNOSIS — Z7982 Long term (current) use of aspirin: Secondary | ICD-10-CM | POA: Diagnosis not present

## 2016-12-02 DIAGNOSIS — J189 Pneumonia, unspecified organism: Secondary | ICD-10-CM | POA: Diagnosis not present

## 2016-12-02 DIAGNOSIS — R579 Shock, unspecified: Secondary | ICD-10-CM | POA: Diagnosis not present

## 2016-12-02 DIAGNOSIS — E860 Dehydration: Secondary | ICD-10-CM | POA: Diagnosis not present

## 2016-12-02 DIAGNOSIS — R51 Headache: Secondary | ICD-10-CM | POA: Diagnosis not present

## 2016-12-02 DIAGNOSIS — Z7901 Long term (current) use of anticoagulants: Secondary | ICD-10-CM | POA: Diagnosis not present

## 2016-12-02 DIAGNOSIS — Z951 Presence of aortocoronary bypass graft: Secondary | ICD-10-CM | POA: Diagnosis not present

## 2016-12-02 DIAGNOSIS — Z452 Encounter for adjustment and management of vascular access device: Secondary | ICD-10-CM | POA: Diagnosis not present

## 2016-12-02 DIAGNOSIS — R570 Cardiogenic shock: Secondary | ICD-10-CM | POA: Diagnosis present

## 2016-12-02 DIAGNOSIS — R571 Hypovolemic shock: Secondary | ICD-10-CM | POA: Diagnosis not present

## 2016-12-02 DIAGNOSIS — Z781 Physical restraint status: Secondary | ICD-10-CM | POA: Diagnosis not present

## 2016-12-02 DIAGNOSIS — J9692 Respiratory failure, unspecified with hypercapnia: Secondary | ICD-10-CM | POA: Diagnosis not present

## 2016-12-02 DIAGNOSIS — H43811 Vitreous degeneration, right eye: Secondary | ICD-10-CM | POA: Diagnosis not present

## 2016-12-02 DIAGNOSIS — I469 Cardiac arrest, cause unspecified: Secondary | ICD-10-CM | POA: Diagnosis not present

## 2016-12-02 DIAGNOSIS — H348312 Tributary (branch) retinal vein occlusion, right eye, stable: Secondary | ICD-10-CM | POA: Diagnosis not present

## 2016-12-02 DIAGNOSIS — Z66 Do not resuscitate: Secondary | ICD-10-CM | POA: Diagnosis not present

## 2016-12-02 DIAGNOSIS — I255 Ischemic cardiomyopathy: Secondary | ICD-10-CM | POA: Diagnosis not present

## 2016-12-02 DIAGNOSIS — Z9911 Dependence on respirator [ventilator] status: Secondary | ICD-10-CM | POA: Diagnosis not present

## 2016-12-02 DIAGNOSIS — N189 Chronic kidney disease, unspecified: Secondary | ICD-10-CM | POA: Diagnosis not present

## 2016-12-02 DIAGNOSIS — I517 Cardiomegaly: Secondary | ICD-10-CM | POA: Diagnosis not present

## 2016-12-02 DIAGNOSIS — J810 Acute pulmonary edema: Secondary | ICD-10-CM | POA: Diagnosis not present

## 2016-12-02 DIAGNOSIS — I519 Heart disease, unspecified: Secondary | ICD-10-CM | POA: Diagnosis not present

## 2016-12-02 DIAGNOSIS — N183 Chronic kidney disease, stage 3 (moderate): Secondary | ICD-10-CM | POA: Diagnosis not present

## 2016-12-02 DIAGNOSIS — E872 Acidosis: Secondary | ICD-10-CM | POA: Diagnosis not present

## 2016-12-02 DIAGNOSIS — J9602 Acute respiratory failure with hypercapnia: Secondary | ICD-10-CM | POA: Diagnosis not present

## 2016-12-02 DIAGNOSIS — R55 Syncope and collapse: Secondary | ICD-10-CM | POA: Diagnosis not present

## 2016-12-02 DIAGNOSIS — H35351 Cystoid macular degeneration, right eye: Secondary | ICD-10-CM | POA: Diagnosis not present

## 2016-12-02 DIAGNOSIS — I50810 Right heart failure, unspecified: Secondary | ICD-10-CM | POA: Diagnosis present

## 2016-12-02 DIAGNOSIS — R404 Transient alteration of awareness: Secondary | ICD-10-CM | POA: Diagnosis not present

## 2016-12-02 DIAGNOSIS — N179 Acute kidney failure, unspecified: Secondary | ICD-10-CM | POA: Diagnosis not present

## 2016-12-02 DIAGNOSIS — I444 Left anterior fascicular block: Secondary | ICD-10-CM | POA: Diagnosis not present

## 2016-12-02 DIAGNOSIS — J8410 Pulmonary fibrosis, unspecified: Secondary | ICD-10-CM | POA: Diagnosis not present

## 2016-12-02 DIAGNOSIS — S2232XA Fracture of one rib, left side, initial encounter for closed fracture: Secondary | ICD-10-CM | POA: Diagnosis not present

## 2016-12-02 DIAGNOSIS — I272 Pulmonary hypertension, unspecified: Secondary | ICD-10-CM | POA: Diagnosis not present

## 2016-12-02 DIAGNOSIS — Z8546 Personal history of malignant neoplasm of prostate: Secondary | ICD-10-CM | POA: Diagnosis not present

## 2016-12-02 DIAGNOSIS — Z430 Encounter for attention to tracheostomy: Secondary | ICD-10-CM | POA: Diagnosis not present

## 2016-12-26 DEATH — deceased

## 2017-02-04 ENCOUNTER — Ambulatory Visit: Payer: Medicare Other | Admitting: Cardiology

## 2017-04-28 ENCOUNTER — Ambulatory Visit (HOSPITAL_COMMUNITY): Payer: Medicare Other

## 2017-04-28 ENCOUNTER — Other Ambulatory Visit (HOSPITAL_COMMUNITY): Payer: Medicare Other

## 2017-10-27 IMAGING — DX DG CHEST 2V
2 series · 2 of 2 positions shown · non-contrast
Comparison: PA and lateral chest x-ray December 18, 2011.

CLINICAL DATA: Shortness of breath since open heart surgery in 3961
with increased symptoms over the past several months and development
of productive cough; former smoker. History of prostate malignancy.

EXAM:
CHEST  2 VIEW

[chest pa]
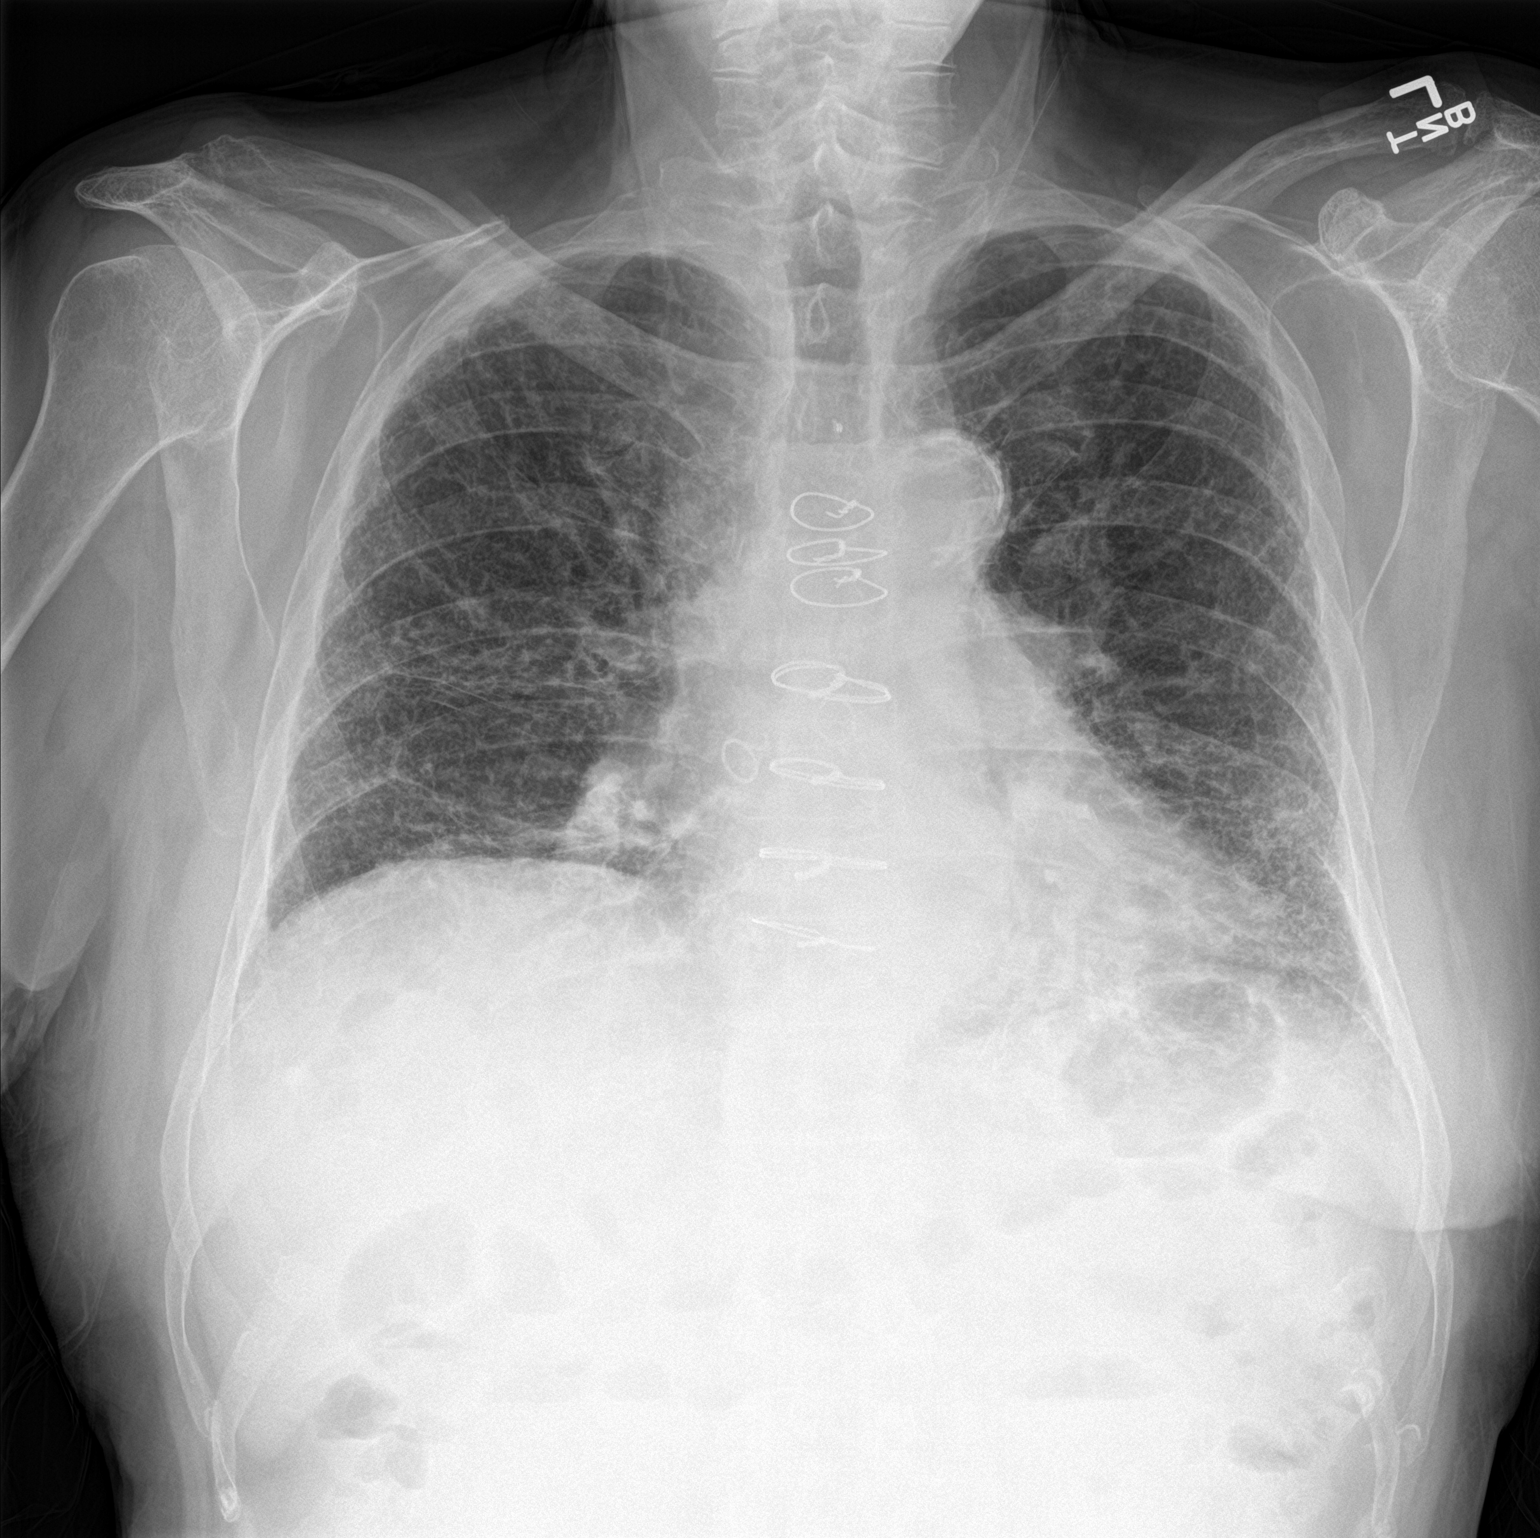

[chest lat]
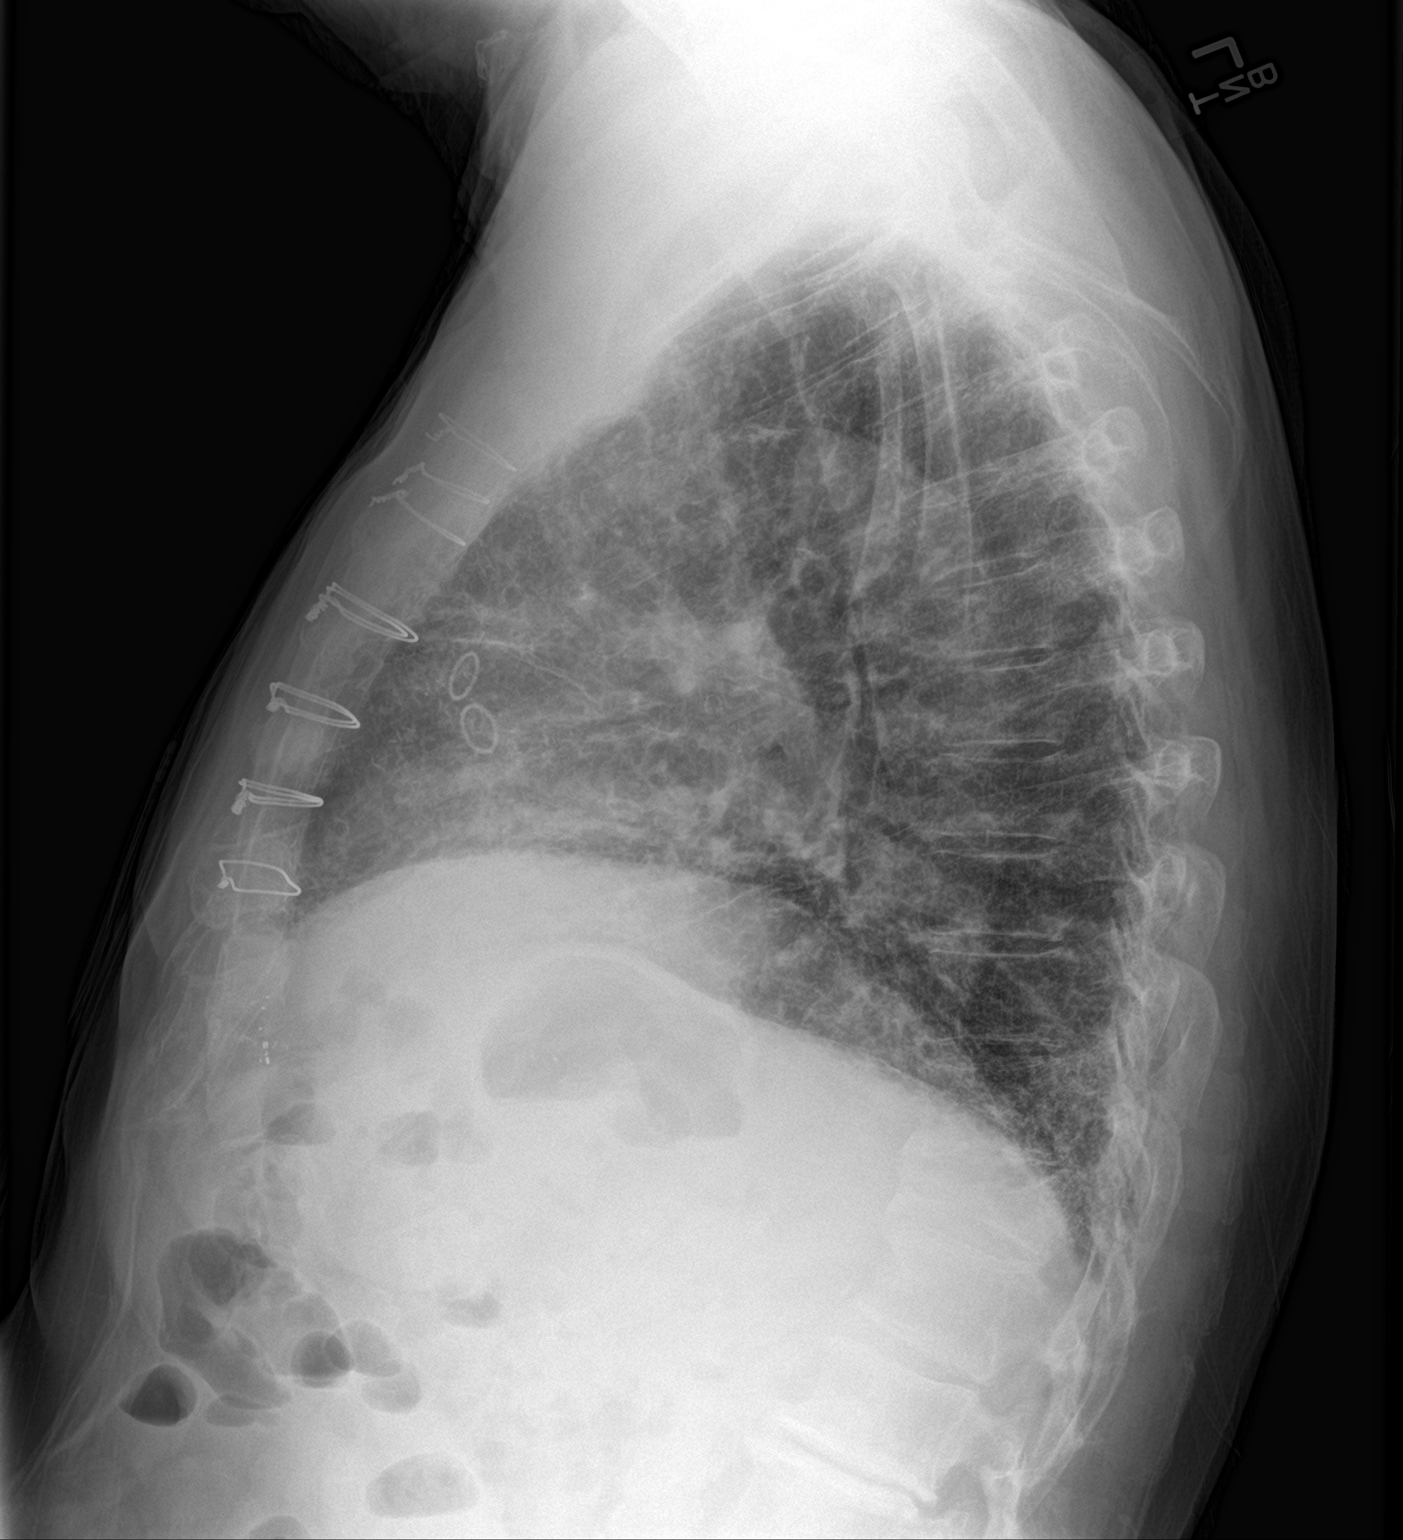

[2 of 2 positions shown; findings below may reference images not displayed]

FINDINGS: The lungs are adequately inflated. The interstitial markings are
chronically increased. The heart is top-normal in size. There are
post CABG changes. There is calcification in the wall of the aortic
arch. There is no pleural effusion. There is stable wedge
compression of the body of T12.
IMPRESSION: Chronic pulmonary fibrotic changes. Cardiomegaly without evidence of
pulmonary edema. Post CABG changes.

If the patient's cough persists, chest CT scanning would be a useful
next imaging step.

Aortic atherosclerosis.
